# Patient Record
Sex: Female | Born: 1950 | Race: White | Hispanic: No | Smoking: Never smoker
Health system: Southern US, Community
[De-identification: ages and names within clinical notes are randomized; demographics above are authoritative.]

## PROBLEM LIST (undated history)

## (undated) DIAGNOSIS — M8580 Other specified disorders of bone density and structure, unspecified site: Secondary | ICD-10-CM

## (undated) DIAGNOSIS — T7840XA Allergy, unspecified, initial encounter: Secondary | ICD-10-CM

## (undated) DIAGNOSIS — D126 Benign neoplasm of colon, unspecified: Secondary | ICD-10-CM

## (undated) DIAGNOSIS — E041 Nontoxic single thyroid nodule: Secondary | ICD-10-CM

## (undated) DIAGNOSIS — I10 Essential (primary) hypertension: Secondary | ICD-10-CM

## (undated) DIAGNOSIS — K589 Irritable bowel syndrome without diarrhea: Secondary | ICD-10-CM

## (undated) DIAGNOSIS — E559 Vitamin D deficiency, unspecified: Secondary | ICD-10-CM

## (undated) DIAGNOSIS — N200 Calculus of kidney: Secondary | ICD-10-CM

## (undated) DIAGNOSIS — E785 Hyperlipidemia, unspecified: Secondary | ICD-10-CM

## (undated) DIAGNOSIS — K219 Gastro-esophageal reflux disease without esophagitis: Secondary | ICD-10-CM

## (undated) HISTORY — DX: Other specified disorders of bone density and structure, unspecified site: M85.80

## (undated) HISTORY — DX: Hyperlipidemia, unspecified: E78.5

## (undated) HISTORY — DX: Allergy, unspecified, initial encounter: T78.40XA

## (undated) HISTORY — PX: TONSILLECTOMY: SUR1361

## (undated) HISTORY — PX: BREAST ENHANCEMENT SURGERY: SHX7

## (undated) HISTORY — DX: Irritable bowel syndrome, unspecified: K58.9

## (undated) HISTORY — DX: Benign neoplasm of colon, unspecified: D12.6

## (undated) HISTORY — DX: Gastro-esophageal reflux disease without esophagitis: K21.9

## (undated) HISTORY — DX: Nontoxic single thyroid nodule: E04.1

## (undated) HISTORY — DX: Vitamin D deficiency, unspecified: E55.9

## (undated) HISTORY — DX: Calculus of kidney: N20.0

---

## 1998-02-18 ENCOUNTER — Other Ambulatory Visit: Admission: RE | Admit: 1998-02-18 | Discharge: 1998-02-18 | Payer: Self-pay | Admitting: Obstetrics and Gynecology

## 1999-08-26 ENCOUNTER — Other Ambulatory Visit: Admission: RE | Admit: 1999-08-26 | Discharge: 1999-08-26 | Payer: Self-pay | Admitting: Obstetrics and Gynecology

## 1999-12-09 ENCOUNTER — Ambulatory Visit (HOSPITAL_COMMUNITY): Admission: RE | Admit: 1999-12-09 | Discharge: 1999-12-09 | Payer: Self-pay | Admitting: Internal Medicine

## 1999-12-09 ENCOUNTER — Encounter: Payer: Self-pay | Admitting: Internal Medicine

## 2000-02-10 ENCOUNTER — Encounter: Payer: Self-pay | Admitting: Gastroenterology

## 2000-02-10 ENCOUNTER — Encounter (INDEPENDENT_AMBULATORY_CARE_PROVIDER_SITE_OTHER): Payer: Self-pay

## 2000-02-10 ENCOUNTER — Other Ambulatory Visit: Admission: RE | Admit: 2000-02-10 | Discharge: 2000-02-10 | Payer: Self-pay | Admitting: Gastroenterology

## 2000-08-12 ENCOUNTER — Other Ambulatory Visit: Admission: RE | Admit: 2000-08-12 | Discharge: 2000-08-12 | Payer: Self-pay | Admitting: Obstetrics and Gynecology

## 2001-08-18 ENCOUNTER — Other Ambulatory Visit: Admission: RE | Admit: 2001-08-18 | Discharge: 2001-08-18 | Payer: Self-pay | Admitting: Obstetrics and Gynecology

## 2002-12-28 ENCOUNTER — Other Ambulatory Visit: Admission: RE | Admit: 2002-12-28 | Discharge: 2002-12-28 | Payer: Self-pay | Admitting: Obstetrics and Gynecology

## 2004-01-06 ENCOUNTER — Other Ambulatory Visit: Admission: RE | Admit: 2004-01-06 | Discharge: 2004-01-06 | Payer: Self-pay | Admitting: Obstetrics and Gynecology

## 2005-01-14 ENCOUNTER — Other Ambulatory Visit: Admission: RE | Admit: 2005-01-14 | Discharge: 2005-01-14 | Payer: Self-pay | Admitting: *Deleted

## 2005-12-13 ENCOUNTER — Ambulatory Visit: Payer: Self-pay | Admitting: Internal Medicine

## 2006-01-20 ENCOUNTER — Other Ambulatory Visit: Admission: RE | Admit: 2006-01-20 | Discharge: 2006-01-20 | Payer: Self-pay | Admitting: Obstetrics & Gynecology

## 2006-02-22 ENCOUNTER — Ambulatory Visit: Payer: Self-pay | Admitting: Internal Medicine

## 2006-03-28 ENCOUNTER — Encounter: Admission: RE | Admit: 2006-03-28 | Discharge: 2006-03-28 | Payer: Self-pay | Admitting: Orthopaedic Surgery

## 2006-07-05 ENCOUNTER — Encounter: Admission: RE | Admit: 2006-07-05 | Discharge: 2006-07-05 | Payer: Self-pay | Admitting: Orthopaedic Surgery

## 2007-01-16 ENCOUNTER — Other Ambulatory Visit: Admission: RE | Admit: 2007-01-16 | Discharge: 2007-01-16 | Payer: Self-pay | Admitting: Obstetrics & Gynecology

## 2008-01-25 ENCOUNTER — Other Ambulatory Visit: Admission: RE | Admit: 2008-01-25 | Discharge: 2008-01-25 | Payer: Self-pay | Admitting: Obstetrics and Gynecology

## 2008-05-28 HISTORY — PX: TOTAL HIP ARTHROPLASTY: SHX124

## 2009-01-28 ENCOUNTER — Telehealth: Payer: Self-pay | Admitting: Gastroenterology

## 2009-01-31 ENCOUNTER — Ambulatory Visit: Payer: Self-pay | Admitting: Gastroenterology

## 2009-02-25 DIAGNOSIS — D126 Benign neoplasm of colon, unspecified: Secondary | ICD-10-CM

## 2009-02-25 HISTORY — DX: Benign neoplasm of colon, unspecified: D12.6

## 2009-03-04 ENCOUNTER — Ambulatory Visit: Payer: Self-pay | Admitting: Gastroenterology

## 2009-03-04 ENCOUNTER — Encounter: Payer: Self-pay | Admitting: Gastroenterology

## 2009-03-04 LAB — HM COLONOSCOPY

## 2009-03-05 ENCOUNTER — Encounter: Payer: Self-pay | Admitting: Gastroenterology

## 2009-05-20 ENCOUNTER — Ambulatory Visit (HOSPITAL_COMMUNITY): Admission: RE | Admit: 2009-05-20 | Discharge: 2009-05-20 | Payer: Self-pay | Admitting: Obstetrics & Gynecology

## 2010-09-16 ENCOUNTER — Telehealth: Payer: Self-pay | Admitting: Gastroenterology

## 2010-10-18 ENCOUNTER — Encounter: Payer: Self-pay | Admitting: Orthopaedic Surgery

## 2010-10-21 ENCOUNTER — Ambulatory Visit: Admit: 2010-10-21 | Payer: Self-pay | Admitting: Gastroenterology

## 2010-10-29 NOTE — Progress Notes (Signed)
Summary: Stomach problems  Phone Note Call from Patient Call back at [phone]   Call For: DR [facility] Reason for Call: Talk to Nurse Summary of Call: Having stomach problems and doesnt think she can wait until next avail appt on 10-21-09. Initial call taken by: Leanor Kail [facility],  September 16, 2010 12:14 PM  Follow-up for Phone Call        patient is advised that no earlier appointments at this time.  I have placed her in the schedule for 10-21-10 and have placed her on the cancelation list. Follow-up by: Darcey Nora RN, CGRN,  September 16, 2010 1:56 PM

## 2011-05-26 ENCOUNTER — Ambulatory Visit (INDEPENDENT_AMBULATORY_CARE_PROVIDER_SITE_OTHER): Payer: BC Managed Care – PPO | Admitting: Gastroenterology

## 2011-05-26 ENCOUNTER — Encounter: Payer: Self-pay | Admitting: Gastroenterology

## 2011-05-26 VITALS — BP 118/64 | HR 80 | Ht 64.0 in | Wt 141.0 lb

## 2011-05-26 DIAGNOSIS — K5900 Constipation, unspecified: Secondary | ICD-10-CM

## 2011-05-26 DIAGNOSIS — Z860100 Personal history of colon polyps, unspecified: Secondary | ICD-10-CM

## 2011-05-26 DIAGNOSIS — K219 Gastro-esophageal reflux disease without esophagitis: Secondary | ICD-10-CM

## 2011-05-26 NOTE — Progress Notes (Signed)
History of Present Illness: This is a 60 year old female who relates worsening problems with constipation bloating and reflux symptoms over the course of the summer. She was evaluated by her primary physician after not having a bowel movement for 7 days. She reports that abdominal films showed constipation. She reports a negative abdominal ultrasound and negative H. pylori antibody. She was started on omeprazole metoclopramide Restora and MiraLax and all her symptoms have completely abated. She underwent colonoscopy in June 2010 showing small adenomatous colon polyps. She currently denies weight loss, abdominal pain, constipation, diarrhea, change in stool caliber, melena, hematochezia, nausea, vomiting, dysphagia, reflux symptoms, chest pain.  Will request records from her primary physician.  Current Medications, Allergies, Past Medical History, Past Surgical History, Family History and Social History were reviewed in [employer] record.  Physical Exam: General: Well developed , well nourished, no acute distress Head: Normocephalic and atraumatic Eyes:  sclerae anicteric, EOMI Ears: Normal auditory acuity Mouth: No deformity or lesions Lungs: Clear throughout to auscultation Heart: Regular rate and rhythm; no murmurs, rubs or bruits Abdomen: Soft, non tender and non distended. No masses, hepatosplenomegaly or hernias noted. Normal Bowel sounds Musculoskeletal: Symmetrical with no gross deformities  Pulses:  Normal pulses noted Extremities: No clubbing, cyanosis, edema or deformities noted Neurological: Alert oriented x 4, grossly nonfocal Psychological:  Alert and cooperative. Normal mood and affect  Assessment and Recommendations:  1. Constipation. High-fiber diet with liberal water intake. Titrate MiraLax between every other day to twice daily for adequate bowel movements. Consider discontinuing probiotics.  2. GERD. Currently asymptomatic. She was not symptomatic  until her constipation flared. Discontinue metoclopramide. Begin all standard antireflux measures. If her reflux symptoms remain under control she can decrease omeprazole to daily and then use daily as needed. If her reflux symptoms remain active consider upper endoscopy for further evaluation.  3. Personal history of adenomatous colon polyps. Surveillance colonoscopy recommended June 2015.

## 2011-05-26 NOTE — Patient Instructions (Addendum)
Start Miralax 17 grams in 8 oz of water once daily to every other day titrating for constipation.  Constipation information given. Stop metoclopramide. Stop omeprazole in 2 weeks and then use as needed.  cc: Jeanmarie Plant, MD

## 2011-05-27 ENCOUNTER — Encounter: Payer: Self-pay | Admitting: Gastroenterology

## 2012-01-17 DIAGNOSIS — Z96649 Presence of unspecified artificial hip joint: Secondary | ICD-10-CM | POA: Insufficient documentation

## 2012-01-17 DIAGNOSIS — Z9889 Other specified postprocedural states: Secondary | ICD-10-CM | POA: Insufficient documentation

## 2012-07-12 ENCOUNTER — Ambulatory Visit: Payer: BC Managed Care – PPO | Admitting: Gastroenterology

## 2013-01-05 ENCOUNTER — Other Ambulatory Visit (INDEPENDENT_AMBULATORY_CARE_PROVIDER_SITE_OTHER): Payer: BC Managed Care – PPO

## 2013-01-05 ENCOUNTER — Encounter: Payer: Self-pay | Admitting: Internal Medicine

## 2013-01-05 ENCOUNTER — Ambulatory Visit (INDEPENDENT_AMBULATORY_CARE_PROVIDER_SITE_OTHER): Payer: BC Managed Care – PPO | Admitting: Internal Medicine

## 2013-01-05 VITALS — BP 130/88 | HR 65 | Temp 98.5°F | Ht 64.0 in | Wt 143.8 lb

## 2013-01-05 DIAGNOSIS — Z0000 Encounter for general adult medical examination without abnormal findings: Secondary | ICD-10-CM

## 2013-01-05 DIAGNOSIS — M899 Disorder of bone, unspecified: Secondary | ICD-10-CM

## 2013-01-05 DIAGNOSIS — Z130 Encounter for screening for diseases of the blood and blood-forming organs and certain disorders involving the immune mechanism: Secondary | ICD-10-CM

## 2013-01-05 DIAGNOSIS — M8580 Other specified disorders of bone density and structure, unspecified site: Secondary | ICD-10-CM

## 2013-01-05 DIAGNOSIS — Z13220 Encounter for screening for lipoid disorders: Secondary | ICD-10-CM

## 2013-01-05 DIAGNOSIS — Z1329 Encounter for screening for other suspected endocrine disorder: Secondary | ICD-10-CM

## 2013-01-05 DIAGNOSIS — Z131 Encounter for screening for diabetes mellitus: Secondary | ICD-10-CM

## 2013-01-05 DIAGNOSIS — M949 Disorder of cartilage, unspecified: Secondary | ICD-10-CM

## 2013-01-05 LAB — BASIC METABOLIC PANEL
BUN: 11 mg/dL (ref 6–23)
Chloride: 102 mEq/L (ref 96–112)
Glucose, Bld: 83 mg/dL (ref 70–99)
Potassium: 3.9 mEq/L (ref 3.5–5.1)
Sodium: 137 mEq/L (ref 135–145)

## 2013-01-05 LAB — LIPID PANEL
HDL: 78.3 mg/dL (ref >39.00)
Triglycerides: 121 mg/dL (ref 0.0–149.0)
VLDL: 24.2 mg/dL (ref 0.0–40.0)

## 2013-01-05 LAB — CBC
MCHC: 33.5 g/dL (ref 30.0–36.0)
Platelets: 274 10*3/uL (ref 150.0–400.0)
RBC: 4.87 Mil/uL (ref 3.87–5.11)

## 2013-01-05 LAB — TSH: TSH: 0.6 u[IU]/mL (ref 0.35–5.50)

## 2013-01-05 LAB — HEMOGLOBIN A1C: Hgb A1c MFr Bld: 5.4 % (ref 4.6–6.5)

## 2013-01-05 LAB — LDL CHOLESTEROL, DIRECT: Direct LDL: 179.1 mg/dL

## 2013-01-05 NOTE — Patient Instructions (Signed)

## 2013-01-05 NOTE — Progress Notes (Signed)
HPI  Pt presents to the clinic today to establish care. She is transferring care from her Doctor in [place] who is retiring. She would like blood work today. She has no concerns.  Flu: never Tetanus: 2009 Pap smear: 09/2012 LMP: post menopausal Colonoscopy: 2010 (5 years) dexa Scan: 2013 Eye doctor: yearly Dentist: Biannually  Past Medical History  Diagnosis Date  . Nephrolithiasis   . Osteopenia   . Hyperlipidemia   . Tubular adenoma of colon 02/2009  . GERD (gastroesophageal reflux disease)   . IBS (irritable bowel syndrome)   . Allergy     Current Outpatient Prescriptions  Medication Sig Dispense Refill  . Calcium Carbonate-Vitamin D (CALTRATE 600+D PO) Take 1 capsule by mouth daily.        Marland Kitchen loratadine (CLARITIN) 10 MG tablet Take 10 mg by mouth daily.       No current facility-administered medications for this visit.    No Known Allergies  Family History  Problem Relation Age of Onset  . Colon cancer Neg Hx   . Atrial fibrillation Father     History   Social History  . Marital Status: Widowed    Spouse Name: N/A    Number of Children: N/A  . Years of Education: 14   Occupational History  . Self-employed     Event and Meeting Planning   Social History Main Topics  . Smoking status: Never Smoker   . Smokeless tobacco: Never Used  . Alcohol Use: Yes     Comment: 1 daily  . Drug Use: No  . Sexually Active: Not Currently   Other Topics Concern  . Not on file   Social History Narrative   Regular exercise-yes   Caffeine Use-yes    ROS:  Constitutional: Denies fever, malaise, fatigue, headache or abrupt weight changes.  HEENT: Pt reports nasal congestion. Denies eye pain, eye redness, ear pain, ringing in the ears, wax buildup, runny nose, bloody nose, or sore throat. Respiratory: Denies difficulty breathing, shortness of breath, cough or sputum production.   Cardiovascular: Denies chest pain, chest tightness, palpitations or swelling in the  hands or feet.  Gastrointestinal: Denies abdominal pain, bloating, constipation, diarrhea or blood in the stool.  GU: Denies frequency, urgency, pain with urination, blood in urine, odor or discharge. Musculoskeletal: Pt reports pain in left wrist. Denies decrease in range of motion, difficulty with gait, muscle pain.  Skin: Denies redness, rashes, lesions or ulcercations.  Neurological: Denies dizziness, difficulty with memory, difficulty with speech or problems with balance and coordination.   No other specific complaints in a complete review of systems (except as listed in HPI above).  PE:  BP 130/88  Pulse 65  Temp(Src) 98.5 F (36.9 C) (Oral)  Ht 5\' 4"  (1.626 m)  Wt 143 lb 12 oz (65.205 kg)  BMI 24.66 kg/m2  SpO2 99% Wt Readings from Last 3 Encounters:  01/05/13 143 lb 12 oz (65.205 kg)  05/26/11 141 lb (63.957 kg)  01/31/09 141 lb (63.957 kg)    General: Appears her stated age, well developed, well nourished in NAD. HEENT: Head: normal shape and size; Eyes: sclera white, no icterus, conjunctiva pink, PERRLA and EOMs intact; Ears: Tm's gray and intact, normal light reflex; Nose: mucosa pink and moist, deviated septum; Throat/Mouth: Teeth present, mucosa pink and moist, no lesions or ulcerations noted.  Neck: Normal range of motion. Neck supple, trachea midline. No massses, lumps or thyromegaly present.  Cardiovascular: Normal rate and rhythm. S1,S2 noted.  No  murmur, rubs or gallops noted. No JVD or BLE edema. No carotid bruits noted. Pulmonary/Chest: Normal effort and positive vesicular breath sounds. No respiratory distress. No wheezes, rales or ronchi noted.  Abdomen: Soft and nontender. Normal bowel sounds, no bruits noted. No distention or masses noted. Liver, spleen and kidneys non palpable. Musculoskeletal: Normal range of motion. No signs of joint swelling. No difficulty with gait.  Neurological: Alert and oriented. Cranial nerves II-XII intact. Coordination normal. +DTRs  bilaterally. Psychiatric: Mood and affect normal. Behavior is normal. Judgment and thought content normal.      Assessment and Plan:  Preventative Health Maintenance:  All HM UTD Will obtain basic screening labs  RTC in 1 year or sooner if needed

## 2013-01-26 ENCOUNTER — Encounter: Payer: Self-pay | Admitting: Internal Medicine

## 2013-01-26 ENCOUNTER — Ambulatory Visit (INDEPENDENT_AMBULATORY_CARE_PROVIDER_SITE_OTHER): Payer: BC Managed Care – PPO | Admitting: Internal Medicine

## 2013-01-26 VITALS — BP 130/90 | HR 64 | Temp 98.0°F | Ht 64.0 in | Wt 142.8 lb

## 2013-01-26 DIAGNOSIS — E785 Hyperlipidemia, unspecified: Secondary | ICD-10-CM | POA: Insufficient documentation

## 2013-01-26 DIAGNOSIS — E559 Vitamin D deficiency, unspecified: Secondary | ICD-10-CM

## 2013-01-26 DIAGNOSIS — J309 Allergic rhinitis, unspecified: Secondary | ICD-10-CM

## 2013-01-26 MED ORDER — METHYLPREDNISOLONE ACETATE 80 MG/ML IJ SUSP
80.0000 mg | Freq: Once | INTRAMUSCULAR | Status: AC
  Administered 2013-01-26: 80 mg via INTRAMUSCULAR

## 2013-01-26 MED ORDER — ERGOCALCIFEROL 1.25 MG (50000 UT) PO CAPS: 50000.0000 [IU] | ORAL_CAPSULE | ORAL | Status: DC

## 2013-01-26 NOTE — Addendum Note (Signed)
Addended by: Carin Primrose on: 01/26/2013 10:39 AM   Modules accepted: Orders

## 2013-01-26 NOTE — Progress Notes (Signed)
HPI  Pt presents to the clinic today with c/o allergy symptoms x 2 weeks. The worst part is the sore throat and dry cough. She does not produce any sputum. She denies fevers. She has tried Careers adviser, nasocort and cough drops which does seems to help. She does have a history of allergies but no asthma.  She does not have sick contacts. Additionally today she would like to follow up on her low Vit D level and her hyperlipidemia. She is on a Vit d and calcium supplement. She does exercise and watch her diet for fat and cholesterol. She is wondering if there is anything else she can do to bring up her vit d and lower her cholesterol.  Review of Systems      Past Medical History  Diagnosis Date  . Nephrolithiasis   . Osteopenia   . Hyperlipidemia   . Tubular adenoma of colon 02/2009  . GERD (gastroesophageal reflux disease)   . IBS (irritable bowel syndrome)   . Allergy     Family History  Problem Relation Age of Onset  . Colon cancer Neg Hx   . Atrial fibrillation Father     History   Social History  . Marital Status: Widowed    Spouse Name: N/A    Number of Children: N/A  . Years of Education: 14   Occupational History  . Self-employed     Event and Meeting Planning   Social History Main Topics  . Smoking status: Never Smoker   . Smokeless tobacco: Never Used  . Alcohol Use: Yes     Comment: 1 daily  . Drug Use: No  . Sexually Active: Not Currently   Other Topics Concern  . Not on file   Social History Narrative   Regular exercise-yes   Caffeine Use-yes    No Known Allergies   Constitutional: Positive headache, fatigue. Denies fever or abrupt weight changes.  HEENT:  Positive sore throat. Denies eye redness, eye pain, pressure behind the eyes, facial pain, nasal congestion, ear pain, ringing in the ears, wax buildup, runny nose or bloody nose. Respiratory: Positive cough. Denies difficulty breathing or shortness of breath.  Cardiovascular: Denies chest pain,  chest tightness, palpitations or swelling in the hands or feet.   No other specific complaints in a complete review of systems (except as listed in HPI above).  Objective:   BP 130/90  Pulse 64  Temp(Src) 98 F (36.7 C) (Oral)  Ht 5\' 4"  (1.626 m)  Wt 142 lb 12 oz (64.751 kg)  BMI 24.49 kg/m2  SpO2 99% Wt Readings from Last 3 Encounters:  01/26/13 142 lb 12 oz (64.751 kg)  01/05/13 143 lb 12 oz (65.205 kg)  05/26/11 141 lb (63.957 kg)     General: Appears her stated age, well developed, well nourished in NAD. HEENT: Head: normal shape and size; Eyes: sclera white, no icterus, conjunctiva pink, PERRLA and EOMs intact; Ears: Tm's gray and intact, normal light reflex; Nose: mucosa boggy and moist, septum midline; Throat/Mouth: + PND. Teeth present, mucosa erythematous and moist, no exudate noted, no lesions or ulcerations noted.  Neck: Mild cervical lymphadenopathy. Neck supple, trachea midline. No massses, lumps or thyromegaly present.  Cardiovascular: Normal rate and rhythm. S1,S2 noted.  No murmur, rubs or gallops noted. No JVD or BLE edema. No carotid bruits noted. Pulmonary/Chest: Normal effort and positive vesicular breath sounds. No respiratory distress. No wheezes, rales or ronchi noted.      Assessment & Plan:   Allergic  Rhinitis, new onset:  Get some rest and drink plenty of water Do salt water gargles for the sore throat Stop Claritn and start Allegra Continue nasocort daily 80 mg Depo IM today  Vit d Deficiency:  Stop taking your current supplement Will call in ergocalciferol 50000 units weekly x 12 weeks Will recheck Vit D in 3 months  RTC as needed or if symptoms persist.

## 2013-01-26 NOTE — Patient Instructions (Signed)
Allergic Rhinitis  Allergic rhinitis is when the mucous membranes in the nose respond to allergens. Allergens are particles in the air that cause your body to have an allergic reaction. This causes you to release allergic antibodies. Through a chain of events, these eventually cause you to release histamine into the blood stream (hence the use of antihistamines). Although meant to be protective to the body, it is this release that causes your discomfort, such as frequent sneezing, congestion and an itchy runny nose.    CAUSES    The pollen allergens may come from grasses, trees, and weeds. This is seasonal allergic rhinitis, or "hay fever." Other allergens cause year-round allergic rhinitis (perennial allergic rhinitis) such as house dust mite allergen, pet dander and mold spores.    SYMPTOMS     Nasal stuffiness (congestion).   Runny, itchy nose with sneezing and tearing of the eyes.   There is often an itching of the mouth, eyes and ears.  It cannot be cured, but it can be controlled with medications.  DIAGNOSIS    If you are unable to determine the offending allergen, skin or blood testing may find it.  TREATMENT     Avoid the allergen.   Medications and allergy shots (immunotherapy) can help.   Hay fever may often be treated with antihistamines in pill or nasal spray forms. Antihistamines block the effects of histamine. There are over-the-counter medicines that may help with nasal congestion and swelling around the eyes. Check with your caregiver before taking or giving this medicine.  If the treatment above does not work, there are many new medications your caregiver can prescribe. Stronger medications may be used if initial measures are ineffective. Desensitizing injections can be used if medications and avoidance fails. Desensitization is when a patient is given ongoing shots until the body becomes less sensitive to the allergen. Make sure you follow up with your caregiver if problems continue.   SEEK MEDICAL CARE IF:     You develop fever (more than 100.5 F (38.1 C).   You develop a cough that does not stop easily (persistent).   You have shortness of breath.   You start wheezing.   Symptoms interfere with normal daily activities.  Document Released: 06/08/2001 Document Revised: 12/06/2011 Document Reviewed: 12/18/2008  ExitCare Patient Information 2013 [employer].

## 2013-01-26 NOTE — Assessment & Plan Note (Signed)
Continue diet and exercise Can take OTC fish oil supplement Will recheck lipids in 3 months

## 2013-04-10 ENCOUNTER — Ambulatory Visit: Payer: BC Managed Care – PPO | Admitting: Internal Medicine

## 2013-05-08 ENCOUNTER — Ambulatory Visit: Payer: BC Managed Care – PPO | Admitting: Internal Medicine

## 2013-07-24 ENCOUNTER — Ambulatory Visit: Payer: Self-pay | Admitting: Obstetrics & Gynecology

## 2013-08-15 ENCOUNTER — Encounter: Payer: Self-pay | Admitting: Obstetrics & Gynecology

## 2013-08-16 ENCOUNTER — Telehealth: Payer: Self-pay | Admitting: Obstetrics & Gynecology

## 2013-08-16 ENCOUNTER — Ambulatory Visit: Payer: Self-pay | Admitting: Obstetrics & Gynecology

## 2013-08-16 NOTE — Telephone Encounter (Signed)
Patient is sick with a fever of 106 and sore throat reschedule aex for 12/23 at 3:30pm

## 2013-08-17 NOTE — Telephone Encounter (Signed)
Do not charge [facility] fee.  Thank you.

## 2013-08-29 ENCOUNTER — Ambulatory Visit: Payer: BC Managed Care – PPO | Admitting: Gastroenterology

## 2013-09-12 ENCOUNTER — Ambulatory Visit: Payer: BC Managed Care – PPO | Admitting: Gastroenterology

## 2013-09-12 ENCOUNTER — Telehealth: Payer: Self-pay | Admitting: Gastroenterology

## 2013-09-12 NOTE — Telephone Encounter (Signed)
Charge per Dr. Russella Dar for same day cancellation.

## 2013-09-18 ENCOUNTER — Ambulatory Visit (INDEPENDENT_AMBULATORY_CARE_PROVIDER_SITE_OTHER): Payer: BC Managed Care – PPO | Admitting: Obstetrics & Gynecology

## 2013-09-18 ENCOUNTER — Encounter: Payer: Self-pay | Admitting: Obstetrics & Gynecology

## 2013-09-18 VITALS — BP 112/70 | HR 74 | Resp 16 | Ht 63.5 in | Wt 142.0 lb

## 2013-09-18 DIAGNOSIS — Z01419 Encounter for gynecological examination (general) (routine) without abnormal findings: Secondary | ICD-10-CM

## 2013-09-18 NOTE — Progress Notes (Signed)
62 y.o. Bonnie Moon WidowedCaucasianF here for annual exam.  No vaginal bleeding.  25 coming tonight for dinner.    No LMP recorded. Patient is not currently having periods (Reason: Perimenopausal).           Sexually active: no  The current method of family planning is post menopausal status.    Exercising: yes   Occ Walking Smoker:  no  Health Maintenance: Pap: 09/14/12 neg History of abnormal Pap:  no MMG:  2014 normal Colonoscopy:  2010 (2) polyps, repeat in 2015,  BMD:   01/17/12 Osteopenia TDaP:  2010 Screening Labs: pcp, Hb today: PCP, Urine today: PCP   reports that she has never smoked. She has never used smokeless tobacco. She reports that she drinks about 0.5 ounces of alcohol per week. She reports that she does not use illicit drugs.  Past Medical History  Diagnosis Date  . Nephrolithiasis   . Osteopenia   . Hyperlipidemia   . Tubular adenoma of colon 02/2009  . GERD (gastroesophageal reflux disease)   . IBS (irritable bowel syndrome)   . Allergy     Past Surgical History  Procedure Laterality Date  . Tonsillectomy    . 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 Calcium Carbonate-Vitamin D (CALTRATE 600+D PO) Take 1 capsule by mouth daily.        . ergocalciferol (VITAMIN D2) 50000 UNITS capsule Take 1 capsule (50,000 Units total) by mouth once a week.  12 capsule  0  . fexofenadine (ALLEGRA) 180 MG tablet Take 180 mg by mouth daily.       No current facility-administered medications for this visit.    Family History  Problem Relation Age of Onset  . Colon cancer Neg Hx   . Atrial fibrillation Father   . Osteopenia Mother     ROS:  Pertinent items are noted in HPI.  Otherwise, a comprehensive ROS was negative.  Exam:   BP 112/70  Pulse 74  Resp 16  Ht 5' 3.5" (1.613 m)  Wt 142 lb (64.411 kg)  BMI 24.76 kg/m2  Weight change: +6lbs  Height: 5' 3.5" (161.3 cm)  Ht Readings from Last 3 Encounters:   09/18/13 5' 3.5" (1.613 m)  01/26/13 5\' 4"  (1.626 m)  01/05/13 5\' 4"  (1.626 m)    General appearance: alert, cooperative and appears stated age Head: Normocephalic, without obvious abnormality, atraumatic Neck: no adenopathy, supple, symmetrical, trachea midline and thyroid normal to inspection and palpation Lungs: clear to auscultation bilaterally Breasts: normal appearance, no masses or tenderness Heart: regular rate and rhythm Abdomen: soft, non-tender; bowel sounds normal; no masses,  no organomegaly Extremities: extremities normal, atraumatic, no cyanosis or edema Skin: Skin color, texture, turgor normal. No rashes or lesions Lymph nodes: Cervical, supraclavicular, and axillary nodes normal. No abnormal inguinal nodes palpated Neurologic: Grossly normal   Pelvic: External genitalia:  no lesions              Urethra:  normal appearing urethra with no masses, tenderness or lesions              Bartholins and Skenes: normal                 Vagina: normal appearing vagina with normal color and discharge, no lesions              Cervix: no lesions  Pap taken: no Bimanual Exam:  Uterus:  normal size, contour, position, consistency, mobility, non-tender              Adnexa: normal adnexa and no mass, fullness, tenderness               Rectovaginal: Confirms               Anus:  normal sphincter tone, no lesions  A:  Well Woman with normal exam  PMP, no HRT Osteopenia  P:   Mammogram yearly pap smear with  Neg HR HPV 10/13 Labs with PCP.  Follow-up early 2015 for repeat lipids. Pt will have colonoscopy this year with Dr. Russella Dar. Zostavax rx given. return annually or prn  An After Visit Summary was printed and given to the patient.

## 2013-09-18 NOTE — Patient Instructions (Signed)

## 2014-01-03 ENCOUNTER — Encounter: Payer: Self-pay | Admitting: Gastroenterology

## 2014-02-04 ENCOUNTER — Telehealth: Payer: Self-pay

## 2014-02-04 NOTE — Telephone Encounter (Signed)
Pharmacist notified.

## 2014-02-04 NOTE — Telephone Encounter (Signed)
Pharmacy called [facility] stating that pt is going out of town today at 2pm and request rx for prednisone. Per pharmacy, pt has used this in the past to help with eustachian tube dyfunction.

## 2014-02-04 NOTE — Telephone Encounter (Signed)
I have not seen her in a year and I dont use prednisone for ETD. She can try flonase OTC

## 2014-02-19 ENCOUNTER — Encounter: Payer: Self-pay | Admitting: Gastroenterology

## 2014-03-25 ENCOUNTER — Ambulatory Visit (AMBULATORY_SURGERY_CENTER): Payer: Self-pay

## 2014-03-25 VITALS — Ht 64.0 in | Wt 139.0 lb

## 2014-03-25 DIAGNOSIS — Z860100 Personal history of colon polyps, unspecified: Secondary | ICD-10-CM

## 2014-03-25 MED ORDER — MOVIPREP 100 G PO SOLR: 1.0000 | Freq: Once | ORAL | Status: DC

## 2014-03-25 NOTE — Progress Notes (Signed)
No allergies to eggs or soy No home oxygen No diet/weight loss meds No past problems with anesthesia  Has email  Emmi instructions given for colonoscopy 

## 2014-03-26 ENCOUNTER — Encounter: Payer: Self-pay | Admitting: Gastroenterology

## 2014-04-11 ENCOUNTER — Encounter: Payer: Self-pay | Admitting: Gastroenterology

## 2014-04-11 ENCOUNTER — Ambulatory Visit (AMBULATORY_SURGERY_CENTER): Payer: BC Managed Care – PPO | Admitting: Gastroenterology

## 2014-04-11 VITALS — BP 185/90 | HR 56 | Temp 97.5°F | Resp 10 | Ht 64.0 in | Wt 139.0 lb

## 2014-04-11 DIAGNOSIS — Z860100 Personal history of colon polyps, unspecified: Secondary | ICD-10-CM

## 2014-04-11 HISTORY — PX: COLONOSCOPY: SHX174

## 2014-04-11 MED ORDER — SODIUM CHLORIDE 0.9 % IV SOLN: 500.0000 mL | INTRAVENOUS | Status: DC

## 2014-04-11 NOTE — Progress Notes (Signed)
Patient's IV was working well with good blood flow before she got up to go to the bathroom.  After that, it was slightly swollen so it was discontinued.   A pressure dressing was applied.  A warm pack was applied.   Edmon Crape was called to re-start the IV.     After about 20 minutes, the patient called me over to look at her arm.  It was the same as before, but she stated that it was "bothering her."  I put some ice on it at that time.   The tech came to get her and she told the tech that it felt "much better."  She was taken to the procedure room at that time.   The area was a little red due to the ice.  No open areas noted; again, the patient stated that it "felt much better."

## 2014-04-11 NOTE — Op Note (Signed)
[place]  [address] [place], [id number]   COLONOSCOPY PROCEDURE REPORT  PATIENT: Bonnie, Moon  MR#: [id number] BIRTHDATE: 05-16-1951 , 63  yrs. old GENDER: Female ENDOSCOPIST: Ladene Artist, MD, [facility] PROCEDURE DATE:  04/11/2014 PROCEDURE:   Colonoscopy, surveillance First Screening Colonoscopy - Avg.  risk and is 50 yrs.  old or older - No.  Prior Negative Screening - Now for repeat screening. N/A  History of Adenoma - Now for follow-up colonoscopy & has been > or = to 3 yrs.  Yes hx of adenoma.  Has been 3 or more years since last colonoscopy.  Polyps Removed Today? No.  Recommend repeat exam, <10 yrs? Yes.  High risk (family or personal hx). ASA CLASS:   Class II INDICATIONS:Patient's personal history of adenomatous colon polyps.  MEDICATIONS: MAC sedation, administered by CRNA and propofol (Diprivan) 200mg  IV DESCRIPTION OF PROCEDURE:   After the risks benefits and alternatives of the procedure were thoroughly explained, informed consent was obtained.  A digital rectal exam revealed no abnormalities of the rectum.   The [id number]  endoscope was introduced through the anus and advanced to the cecum, which was identified by both the appendix and ileocecal valve. No adverse events experienced.   The quality of the prep was excellent, using MoviPrep  The instrument was then slowly withdrawn as the colon was fully examined.  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  No bleeding was noted.   The colon was otherwise normal.  There was no diverticulosis, inflammation, polyps or cancers unless previously stated.  Retroflexed views revealed no abnormalities. The time to cecum=4 minutes 13 seconds.  Withdrawal time=9 minutes 23 seconds. The scope was withdrawn and the procedure completed.  COMPLICATIONS: There were no complications.  ENDOSCOPIC IMPRESSION: 1.   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   The colon was otherwise  normal  RECOMMENDATIONS: 1.  Repeat Colonoscopy in 5 years.  eSigned:  Ladene Artist, MD, [facility] 04/11/2014 8:58 AM

## 2014-04-11 NOTE — Progress Notes (Signed)
A/ox3 pleased with MAC, report to Penny RN 

## 2014-04-11 NOTE — Patient Instructions (Signed)
YOU HAD AN ENDOSCOPIC PROCEDURE TODAY AT THE [facility]: Refer to the procedure report that was given to you for any specific questions about what was found during the examination.  If the procedure report does not answer your questions, please call your gastroenterologist to clarify.  If you requested that your care partner not be given the details of your procedure findings, then the procedure report has been included in a sealed envelope for you to review at your convenience later.  YOU SHOULD EXPECT: Some feelings of bloating in the abdomen. Passage of more gas than usual.  Walking can help get rid of the air that was put into your GI tract during the procedure and reduce the bloating. If you had a lower endoscopy (such as a colonoscopy or flexible sigmoidoscopy) you may notice spotting of blood in your stool or on the toilet paper. If you underwent a bowel prep for your procedure, then you may not have a normal bowel movement for a few days.  DIET: Your first meal following the procedure should be a light meal and then it is ok to progress to your normal diet.  A half-sandwich or bowl of soup is an example of a good first meal.  Heavy or fried foods are harder to digest and may make you feel nauseous or bloated.  Likewise meals heavy in dairy and vegetables can cause extra gas to form and this can also increase the bloating.  Drink plenty of fluids but you should avoid alcoholic beverages for 24 hours.  ACTIVITY: Your care partner should take you home directly after the procedure.  You should plan to take it easy, moving slowly for the rest of the day.  You can resume normal activity the day after the procedure however you should NOT DRIVE or use heavy machinery for 24 hours (because of the sedation medicines used during the test).    SYMPTOMS TO REPORT IMMEDIATELY: A gastroenterologist can be reached at any hour.  During normal business hours, 8:30 AM to 5:00 PM Monday through Friday,  call [phone].  After hours and on weekends, please call the GI answering service at [phone] who will take a message and have the physician on call contact you.   Following lower endoscopy (colonoscopy or flexible sigmoidoscopy):  Excessive amounts of blood in the stool  Significant tenderness or worsening of abdominal pains  Swelling of the abdomen that is new, acute  Fever of 100F or higher    FOLLOW UP: If any biopsies were taken you will be contacted by phone or by letter within the next 1-3 weeks.  Call your gastroenterologist if you have not heard about the biopsies in 3 weeks.  Our staff will call the home number listed on your records the next business day following your procedure to check on you and address any questions or concerns that you may have at that time regarding the information given to you following your procedure. This is a courtesy call and so if there is no answer at the home number and we have not heard from you through the emergency physician on call, we will assume that you have returned to your regular daily activities without incident.  SIGNATURES/CONFIDENTIALITY: You and/or your care partner have signed paperwork which will be entered into your electronic medical record.  These signatures attest to the fact that that the information above on your After Visit Summary has been reviewed and is understood.  Full responsibility of the confidentiality   of this discharge information lies with you and/or your care-partner.     

## 2014-04-12 ENCOUNTER — Telehealth: Payer: Self-pay | Admitting: *Deleted

## 2014-04-12 NOTE — Telephone Encounter (Signed)
  Follow up Call-  Call back number 04/11/2014  Post procedure Call Back phone  # [phone]  Permission to leave phone message Yes     Patient questions:  Do you have a fever, pain , or abdominal swelling? No.   Stated that IV site was fine. Pain Score  0 *  Have you tolerated food without any problems? Yes.    Have you been able to return to your normal activities? Yes.    Do you have any questions about your discharge instructions: Diet   No. Medications  No. Follow up visit  No.  Do you have questions or concerns about your Care? No  Actions: * If pain score is 4 or above: No action needed, pain <4.

## 2014-07-29 ENCOUNTER — Encounter: Payer: Self-pay | Admitting: Gastroenterology

## 2014-09-24 ENCOUNTER — Telehealth: Payer: Self-pay | Admitting: Obstetrics & Gynecology

## 2014-09-24 DIAGNOSIS — Z0000 Encounter for general adult medical examination without abnormal findings: Secondary | ICD-10-CM

## 2014-09-24 NOTE — Telephone Encounter (Signed)
Spoke with patient. Patient states that her PCP retired a year ago and she has not been able to have blood work checked this year. Has lab appointment scheduled at our office before annual on 10/08/14. "I just want to make sure that everything I need for a woman my age is checked. I know I need my cholesterol, vitamin D, thyroid, and iron checked. I also want my kidney function and WBC checked. I have had bronchitis for a couple of weeks. I just want to make sure everything is covered."  Advised patient will send a message over to [facility] and all labs recommended will be order for lab appointment. Patient is agreeable.

## 2014-09-24 NOTE — Telephone Encounter (Signed)
Orders placed for labs for pt.  Encounter closed.

## 2014-09-24 NOTE — Telephone Encounter (Signed)
Patient called and scheduled fasting labs for 10/08/17. AEX 10/14/14. She will need orders for the testing but also has some questions for the nurse about what needs tested. Please advise patient.

## 2014-10-08 ENCOUNTER — Other Ambulatory Visit (INDEPENDENT_AMBULATORY_CARE_PROVIDER_SITE_OTHER): Payer: BLUE CROSS/BLUE SHIELD

## 2014-10-08 DIAGNOSIS — Z0000 Encounter for general adult medical examination without abnormal findings: Secondary | ICD-10-CM

## 2014-10-08 LAB — COMPREHENSIVE METABOLIC PANEL WITH GFR
ALT: 14 U/L (ref 0–35)
AST: 20 U/L (ref 0–37)
Albumin: 4 g/dL (ref 3.5–5.2)
Alkaline Phosphatase: 121 U/L — ABNORMAL HIGH (ref 39–117)
BUN: 14 mg/dL (ref 6–23)
CO2: 28 meq/L (ref 19–32)
Calcium: 10.2 mg/dL (ref 8.4–10.5)
Chloride: 104 meq/L (ref 96–112)
Creat: 0.7 mg/dL (ref 0.50–1.10)
Glucose, Bld: 69 mg/dL — ABNORMAL LOW (ref 70–99)
Potassium: 4.1 meq/L (ref 3.5–5.3)
Sodium: 140 meq/L (ref 135–145)
Total Bilirubin: 0.5 mg/dL (ref 0.2–1.2)
Total Protein: 6.8 g/dL (ref 6.0–8.3)

## 2014-10-08 LAB — LIPID PANEL
Cholesterol: 259 mg/dL — ABNORMAL HIGH (ref 0–200)
HDL: 79 mg/dL
LDL Cholesterol: 161 mg/dL — ABNORMAL HIGH (ref 0–99)
Total CHOL/HDL Ratio: 3.3 ratio
Triglycerides: 96 mg/dL
VLDL: 19 mg/dL (ref 0–40)

## 2014-10-08 LAB — CBC
HCT: 41.5 % (ref 36.0–46.0)
Hemoglobin: 13.7 g/dL (ref 12.0–15.0)
MCH: 29.9 pg (ref 26.0–34.0)
MCHC: 33 g/dL (ref 30.0–36.0)
MCV: 90.6 fL (ref 78.0–100.0)
MPV: 9.2 fL (ref 8.6–12.4)
PLATELETS: 255 10*3/uL (ref 150–400)
RBC: 4.58 MIL/uL (ref 3.87–5.11)
RDW: 14 % (ref 11.5–15.5)
WBC: 6.1 10*3/uL (ref 4.0–10.5)

## 2014-10-08 LAB — TSH: TSH: 0.577 u[IU]/mL (ref 0.350–4.500)

## 2014-10-09 LAB — VITAMIN D 25 HYDROXY (VIT D DEFICIENCY, FRACTURES): Vit D, 25-Hydroxy: 30 ng/mL (ref 30–100)

## 2014-10-14 ENCOUNTER — Ambulatory Visit (INDEPENDENT_AMBULATORY_CARE_PROVIDER_SITE_OTHER): Payer: BLUE CROSS/BLUE SHIELD | Admitting: Obstetrics & Gynecology

## 2014-10-14 ENCOUNTER — Encounter: Payer: Self-pay | Admitting: Obstetrics & Gynecology

## 2014-10-14 VITALS — BP 124/82 | HR 56 | Resp 16 | Ht 63.75 in | Wt 139.6 lb

## 2014-10-14 DIAGNOSIS — Z01419 Encounter for gynecological examination (general) (routine) without abnormal findings: Secondary | ICD-10-CM

## 2014-10-14 DIAGNOSIS — Z124 Encounter for screening for malignant neoplasm of cervix: Secondary | ICD-10-CM

## 2014-10-14 DIAGNOSIS — E785 Hyperlipidemia, unspecified: Secondary | ICD-10-CM

## 2014-10-14 DIAGNOSIS — Z0000 Encounter for general adult medical examination without abnormal findings: Secondary | ICD-10-CM

## 2014-10-14 LAB — POCT URINALYSIS DIPSTICK
Bilirubin, UA: NEGATIVE
Blood, UA: NEGATIVE
Glucose, UA: NEGATIVE
Ketones, UA: NEGATIVE
Leukocytes, UA: NEGATIVE
NITRITE UA: NEGATIVE
Protein, UA: NEGATIVE
Urobilinogen, UA: NEGATIVE
pH, UA: 7

## 2014-10-14 NOTE — Progress Notes (Signed)
64 y.o. G4P3 WidowedCaucasianF here for annual exam.  Doing well.  No vaginal bleeding.  Needs new PCP.  Labs done last week.  Cholesterol elevated.  Would prefer to see a woman.  Reports she had a cough and URI for about two weeks over the holidays.  Pt still has a cough.    Patient's last menstrual period was 07/28/2002.          Sexually active: No.  The current method of family planning is post menopausal status.    Exercising: Yes.    walking Smoker:  no  Health Maintenance: Pap:  09/14/12 WNL/negative HR HPV History of abnormal Pap:  no MMG:  06/10/14 3D-normal Colonoscopy:  2015-repeat in 5 years, negative, h/o adenomatous polyps, Dr. Fuller Plan.  BMD:   01/17/12-osteoporosis, -2.2/-2.3 (stable from 2011) TDaP:  2010 Screening Labs: here, Hb today: not needed, Urine today: PH-7.0   reports that she has never smoked. She has never used smokeless tobacco. She reports that she drinks about 1.2 oz of alcohol per week. She reports that she does not use illicit drugs.  Past Medical History  Diagnosis Date  . Nephrolithiasis   . Osteopenia   . Hyperlipidemia   . Tubular adenoma of colon 02/2009  . GERD (gastroesophageal reflux disease)   . IBS (irritable bowel syndrome)   . Allergy     Past Surgical History  Procedure Laterality Date  . Tonsillectomy    . 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 calcium carbonate (OS-CAL) 600 MG TABS tablet Take 600 mg by mouth 2 (two) times daily with a meal.    . Cholecalciferol (VITAMIN D-3) 1000 UNITS CAPS Take by mouth.    . Multiple Vitamins-Minerals (MULTIVITAMIN PO) Take by mouth. gummies 2 daily     No current facility-administered medications for this visit.    Family History  Problem Relation Age of Onset  . Colon cancer Neg Hx   . Pancreatic cancer Neg Hx   . Rectal cancer Neg Hx   . Stomach cancer Neg Hx   . Atrial fibrillation Father   . Osteopenia Mother   . Heart  disease Mother   . Thyroid disease Daughter     thyroidectomy-nodules/cancer  . High Cholesterol Daughter   . High Cholesterol Mother     ROS:  Pertinent items are noted in HPI.  Otherwise, a comprehensive ROS was negative.  Exam:   BP 124/82 mmHg  Pulse 56  Resp 16  Ht 5' 3.75" (1.619 m)  Wt 139 lb 9.6 oz (63.322 kg)  BMI 24.16 kg/m2  LMP 07/28/2002    Height: 5' 3.75" (161.9 cm)  Ht Readings from Last 3 Encounters:  10/14/14 5' 3.75" (1.619 m)  04/11/14 5\' 4"  (1.626 m)  03/25/14 5\' 4"  (1.626 m)    General appearance: alert, cooperative and appears stated age Head: Normocephalic, without obvious abnormality, atraumatic Neck: no adenopathy, supple, symmetrical, trachea midline and thyroid normal to inspection and palpation Lungs: clear to auscultation bilaterally Breasts: normal appearance, no masses or tenderness , bilateral implants Heart: regular rate and rhythm Abdomen: soft, non-tender; bowel sounds normal; no masses,  no organomegaly Extremities: extremities normal, atraumatic, no cyanosis or edema Skin: Skin color, texture, turgor normal. No rashes or lesions Lymph nodes: Cervical, supraclavicular, and axillary nodes normal. No abnormal inguinal nodes palpated Neurologic: Grossly normal   Pelvic: External genitalia:  no lesions  Urethra:  normal appearing urethra with no masses, tenderness or lesions              Bartholins and Skenes: normal                 Vagina: normal appearing vagina with normal color and discharge, no lesions              Cervix: no lesions              Pap taken: Yes.   Bimanual Exam:  Uterus:  normal size, contour, position, consistency, mobility, non-tender              Adnexa: normal adnexa and no mass, fullness, tenderness               Rectovaginal: Confirms               Anus:  normal sphincter tone, no lesions  Chaperone was present for exam.  A:  Well Woman with normal exam  PMP, no HRT Osteopenia Recent URI,  now with cough Several family members with thyroid disease  P: Mammogram yearly.  Will do BMD with MMG in the fall. pap smear with Neg HR HPV 10/13.  Pap today. Labs last week.  Elevated cholesterol.  Needs new PCP.  Will refer to Dr. Colin Benton Will plan to do shingles vaccine with Dr. Maudie Mercury return annually or prn

## 2014-10-17 LAB — IPS PAP TEST WITH REFLEX TO HPV

## 2014-10-29 ENCOUNTER — Telehealth: Payer: Self-pay | Admitting: Internal Medicine

## 2014-10-29 NOTE — Telephone Encounter (Signed)
Patient is being referred to Primary Care from [facility] for evaluation of hyperlipidemia.  Pt was previous pt of Webb Silversmith when she was at the [place] office.  However, now that Rollene Fare is at [facility] pt would like to see provider in [place].  Requesting to see Dr. Maudie Mercury.  Please advise if ok to transfer care to Dr. Maudie Mercury and schedule appt.  Insurance is [employer] [place].

## 2014-10-29 NOTE — Telephone Encounter (Signed)
Ok in new patient slot. Thank you.

## 2014-10-29 NOTE — Telephone Encounter (Signed)
Ok with me 

## 2015-01-13 ENCOUNTER — Ambulatory Visit: Payer: Self-pay | Admitting: Family Medicine

## 2015-01-23 ENCOUNTER — Ambulatory Visit: Payer: Self-pay | Admitting: Family Medicine

## 2015-05-07 ENCOUNTER — Ambulatory Visit: Payer: Self-pay | Admitting: Family Medicine

## 2015-07-10 ENCOUNTER — Ambulatory Visit (INDEPENDENT_AMBULATORY_CARE_PROVIDER_SITE_OTHER): Payer: BLUE CROSS/BLUE SHIELD | Admitting: Family Medicine

## 2015-07-10 ENCOUNTER — Encounter: Payer: Self-pay | Admitting: Family Medicine

## 2015-07-10 VITALS — BP 100/76 | HR 71 | Temp 98.0°F | Ht 63.0 in | Wt 141.1 lb

## 2015-07-10 DIAGNOSIS — E785 Hyperlipidemia, unspecified: Secondary | ICD-10-CM

## 2015-07-10 DIAGNOSIS — J309 Allergic rhinitis, unspecified: Secondary | ICD-10-CM | POA: Diagnosis not present

## 2015-07-10 DIAGNOSIS — R748 Abnormal levels of other serum enzymes: Secondary | ICD-10-CM

## 2015-07-10 DIAGNOSIS — Z7189 Other specified counseling: Secondary | ICD-10-CM | POA: Diagnosis not present

## 2015-07-10 DIAGNOSIS — Z1159 Encounter for screening for other viral diseases: Secondary | ICD-10-CM

## 2015-07-10 DIAGNOSIS — Z7689 Persons encountering health services in other specified circumstances: Secondary | ICD-10-CM

## 2015-07-10 NOTE — Progress Notes (Signed)
HPI:  Bonnie Moon is here to establish care. She used to see [employer].  Sees Dr. Sabra Heck for annual women's exams and sees Dr. Fuller Plan and a dermatologist. She reports she no longer has issues with GERD or IBS. 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 She has "terrible" allergic rhinitis and sees Dr. Tami Ribas in ENT for this. She takes claritin intermittently and nasal sprays. She does bone density exams with her gynecologist and has mild osteopenia per her report. History of elevated cholesterol in the past and she wants to check this. On review chart she also had a very mildly elevated alk phos in the past. She does have vit d. She is taking vit D.  Has the following chronic problems that require follow up and concerns today:   ROS negative for unless reported above: fevers, unintentional weight loss, hearing or vision loss, chest pain, palpitations, struggling to breath, hemoptysis, melena, hematochezia, hematuria, falls, loc, si, thoughts of self harm  Past Medical History  Diagnosis Date  . Nephrolithiasis   . Osteopenia   . Hyperlipidemia   . Tubular adenoma of colon 02/2009  . GERD (gastroesophageal reflux disease)   . IBS (irritable bowel syndrome)   . Allergy   . Vitamin D insufficiency     Past Surgical History  Procedure Laterality Date  . Tonsillectomy    . 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 Colon cancer Neg Hx   . Pancreatic cancer Neg Hx   . Rectal cancer Neg Hx   . Stomach cancer Neg Hx   . Atrial fibrillation Father   . Osteopenia Mother   . Heart disease Mother     a. fib  . High Cholesterol Mother   . Thyroid disease Daughter     thyroidectomy-nodules/cancer  . High Cholesterol Daughter     Social History   Social History  . Marital Status: Widowed    Spouse Name: N/A  . Number of Children: N/A  . Years of Education: 14    Occupational History  . Self-employed     Event and Meeting Planning   Social History Main Topics  . Smoking status: Never Smoker   . Smokeless tobacco: Never Used  . Alcohol Use: 1.2 oz/week    2 Standard drinks or equivalent per week     Comment: 1-2 drinks occ  . Drug Use: No  . Sexual Activity: No   Other Topics Concern  . None   Social History Narrative   Regular exercise-yes   Caffeine Use-yes   Lives alone   [place] [employer]   Regular exercise   Diet is good     Current outpatient prescriptions:  .  calcium carbonate (OS-CAL) 600 MG TABS tablet, Take 600 mg by mouth 2 (two) times daily with a meal., Disp: , Rfl:  .  Cholecalciferol (VITAMIN D-3) 1000 UNITS CAPS, Take by mouth., Disp: , Rfl:  .  Multiple Vitamins-Minerals (MULTIVITAMIN PO), Take by mouth. gummies 2 daily, Disp: , Rfl:   EXAM:  Filed Vitals:   07/10/15 1133  BP: 100/76  Pulse: 71  Temp: 98 F (36.7 C)    Body mass index is 25 kg/(m^2).  GENERAL: vitals reviewed and listed above, alert, oriented, appears well hydrated and in no acute distress  HEENT: atraumatic, conjunttiva clear, no obvious abnormalities on inspection of external  nose and ears  NECK: no obvious masses on inspection  LUNGS: clear to auscultation bilaterally, no wheezes, rales or rhonchi, good air movement  CV: HRRR, no peripheral edema  MS: moves all extremities without noticeable abnormality  PSYCH: pleasant and cooperative, no obvious depression or anxiety  ASSESSMENT AND PLAN:  Discussed the following assessment and plan:  Hyperlipemia  Elevated alkaline phosphatase level  Allergic rhinitis, unspecified allergic rhinitis type  Need for hepatitis C screening test  Encounter to establish care -We reviewed the PMH, PSH, FH, SH, Meds and Allergies. -We provided refills for any medications we will prescribe as needed. -We addressed current concerns per orders and patient instructions. -We have  asked for records for pertinent exams, studies, vaccines and notes from previous providers. -We have advised patient to follow up per instructions below.   -Patient advised to return or notify a doctor immediately if symptoms worsen or persist or new concerns arise.  Patient Instructions  It was nice to meet you today.  Please schedule a fasting lab appointment and consider getting the flu shot. Drink plenty of water prior to your lab visit but do not eat for 8 hours prior to labs  We recommend the following healthy lifestyle measures: - eat a healthy whole foods diet consisting of regular small meals composed of vegetables, fruits, beans, nuts, seeds, healthy meats such as white chicken and fish and whole grains.  - avoid sweets, white starchy foods, fried foods, fast food, processed foods, sodas, red meet and other fattening foods.  - get a least 150-300 minutes of aerobic exercise per week.   Follow up yearly and as needed     Crystel Demarco, Jarrett Soho R.

## 2015-07-10 NOTE — Patient Instructions (Signed)
It was nice to meet you today.  Please schedule a fasting lab appointment and consider getting the flu shot. Drink plenty of water prior to your lab visit but do not eat for 8 hours prior to labs  We recommend the following healthy lifestyle measures: - eat a healthy whole foods diet consisting of regular small meals composed of vegetables, fruits, beans, nuts, seeds, healthy meats such as white chicken and fish and whole grains.  - avoid sweets, white starchy foods, fried foods, fast food, processed foods, sodas, red meet and other fattening foods.  - get a least 150-300 minutes of aerobic exercise per week.   Follow up yearly and as needed

## 2015-07-10 NOTE — Progress Notes (Signed)
Pre visit review using our clinic review tool, if applicable. No additional management support is needed unless otherwise documented below in the visit note. 

## 2015-09-15 ENCOUNTER — Telehealth: Payer: Self-pay | Admitting: *Deleted

## 2015-09-15 NOTE — Telephone Encounter (Signed)
-----   Message from [name], DO sent at 09/15/2015  6:45 AM EST ----- Can you check why overdue? ----- Message -----    From: SYSTEM    Sent: 09/14/2015  12:04 AM      To: [name], DO

## 2015-09-15 NOTE — Telephone Encounter (Signed)
I called the pt and she stated she will call back to schedule a fasting lab appt as she has been busy but will schedule for next year.

## 2015-11-13 ENCOUNTER — Telehealth: Payer: Self-pay | Admitting: Obstetrics & Gynecology

## 2015-11-13 DIAGNOSIS — Z0000 Encounter for general adult medical examination without abnormal findings: Secondary | ICD-10-CM

## 2015-11-13 NOTE — Telephone Encounter (Signed)
Patient is scheduled for AEX 12/12/15 with Dr. Sabra Heck, patient would like to come in sooner so she can have bloodwork done and Dr. Sabra Heck can review it with her at her AEX. Scheduled patient for lab appt 12/08/15 okay to put in orders?

## 2015-11-13 NOTE — Telephone Encounter (Signed)
Orders placed for lab work patient had in 2016 so that she may have these rechecked this year. Future orders placed for lipid panel, TSH, CBC, CMP, and vitamin D. She is scheduled for lab appointment on 12/08/2015. Aex is scheduled for 12/12/2015 with Dr.Miller.  Routing to provider for final review. Patient agreeable to disposition. Will close encounter.

## 2015-12-08 ENCOUNTER — Other Ambulatory Visit: Payer: BLUE CROSS/BLUE SHIELD

## 2015-12-08 DIAGNOSIS — Z0000 Encounter for general adult medical examination without abnormal findings: Secondary | ICD-10-CM

## 2015-12-08 LAB — LIPID PANEL
CHOL/HDL RATIO: 3.6 ratio (ref <=5.0)
Cholesterol: 255 mg/dL — ABNORMAL HIGH (ref 125–200)
HDL: 70 mg/dL (ref >=46)
LDL CALC: 157 mg/dL — AB (ref <130)
Triglycerides: 141 mg/dL (ref <150)
VLDL: 28 mg/dL (ref <30)

## 2015-12-08 LAB — CBC
HEMATOCRIT: 41.4 % (ref 36.0–46.0)
Hemoglobin: 13.8 g/dL (ref 12.0–15.0)
MCH: 30.5 pg (ref 26.0–34.0)
MCHC: 33.3 g/dL (ref 30.0–36.0)
MCV: 91.4 fL (ref 78.0–100.0)
MPV: 9.2 fL (ref 8.6–12.4)
PLATELETS: 267 10*3/uL (ref 150–400)
RBC: 4.53 MIL/uL (ref 3.87–5.11)
RDW: 13.7 % (ref 11.5–15.5)
WBC: 5.6 10*3/uL (ref 4.0–10.5)

## 2015-12-08 LAB — COMPREHENSIVE METABOLIC PANEL
ALBUMIN: 4.2 g/dL (ref 3.6–5.1)
ALT: 12 U/L (ref 6–29)
AST: 19 U/L (ref 10–35)
Alkaline Phosphatase: 129 U/L (ref 33–130)
BUN: 12 mg/dL (ref 7–25)
CALCIUM: 10.3 mg/dL (ref 8.6–10.4)
CHLORIDE: 104 mmol/L (ref 98–110)
CO2: 29 mmol/L (ref 20–31)
Creat: 0.62 mg/dL (ref 0.50–0.99)
GLUCOSE: 81 mg/dL (ref 65–99)
POTASSIUM: 4.2 mmol/L (ref 3.5–5.3)
Sodium: 141 mmol/L (ref 135–146)
Total Bilirubin: 0.7 mg/dL (ref 0.2–1.2)
Total Protein: 7 g/dL (ref 6.1–8.1)

## 2015-12-08 LAB — TSH: TSH: 0.43 m[IU]/L

## 2015-12-09 LAB — VITAMIN D 25 HYDROXY (VIT D DEFICIENCY, FRACTURES): Vit D, 25-Hydroxy: 30 ng/mL (ref 30–100)

## 2015-12-12 ENCOUNTER — Ambulatory Visit (INDEPENDENT_AMBULATORY_CARE_PROVIDER_SITE_OTHER): Payer: BLUE CROSS/BLUE SHIELD | Admitting: Obstetrics & Gynecology

## 2015-12-12 ENCOUNTER — Encounter: Payer: Self-pay | Admitting: Obstetrics & Gynecology

## 2015-12-12 VITALS — BP 128/80 | HR 72 | Resp 14 | Ht 63.5 in | Wt 143.0 lb

## 2015-12-12 DIAGNOSIS — Z01419 Encounter for gynecological examination (general) (routine) without abnormal findings: Secondary | ICD-10-CM

## 2015-12-12 DIAGNOSIS — Z0000 Encounter for general adult medical examination without abnormal findings: Secondary | ICD-10-CM

## 2015-12-12 LAB — POCT URINALYSIS DIPSTICK
Bilirubin, UA: NEGATIVE
Glucose, UA: NEGATIVE
KETONES UA: NEGATIVE
Leukocytes, UA: NEGATIVE
Nitrite, UA: NEGATIVE
PROTEIN UA: NEGATIVE
RBC UA: NEGATIVE
Urobilinogen, UA: NEGATIVE
pH, UA: 5

## 2015-12-12 MED ORDER — ALPRAZOLAM 0.25 MG PO TABS: 0.2500 mg | ORAL_TABLET | Freq: Every evening | ORAL | Status: DC | PRN

## 2015-12-12 NOTE — Addendum Note (Signed)
Addended by: [name] on: 12/12/2015 02:04 PM   Modules accepted: Orders, SmartSet

## 2015-12-12 NOTE — Progress Notes (Addendum)
65 y.o. G4P3 WidowedCaucasianF here for annual exam.  Pt reports lots of stress with her parents.  Pt reports she's doing lots of care with them.  She's gained some weight.  Would like blood work today.  Denies vaginal bleeding.    Patient's last menstrual period was 07/28/2002.          Sexually active: No.  The current method of family planning is post menopausal status.    Exercising: Yes.    Walking Smoker:  no  Health Maintenance: Pap:  10/14/14 Neg. 09/14/12 Neg. HR HPV:neg History of abnormal Pap:  no MMG:  08/04/15 BIRADS1:neg Colonoscopy:  04/11/14 polyps -Repeat 5 years  BMD:  01/17/12 Osteoporosis  TDaP:  2010 Hep C: 10/16 Dr. Maudie Mercury.  Negative. Screening Labs: Done, Urine today: Negative    reports that she has never smoked. She has never used smokeless tobacco. She reports that she drinks about 1.2 oz of alcohol per week. She reports that she does not use illicit drugs.  Past Medical History  Diagnosis Date  . Nephrolithiasis   . Osteopenia   . Hyperlipidemia   . Tubular adenoma of colon 02/2009  . GERD (gastroesophageal reflux disease)   . IBS (irritable bowel syndrome)   . Allergy   . Vitamin D insufficiency     Past Surgical History  Procedure Laterality Date  . Tonsillectomy    . 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 calcium carbonate (OS-CAL) 600 MG TABS tablet Take 600 mg by mouth 2 (two) times daily with a meal.    . Cholecalciferol (VITAMIN D-3) 1000 UNITS CAPS Take by mouth.    . Multiple Vitamins-Minerals (MULTIVITAMIN PO) Take by mouth. gummies 2 daily     No current facility-administered medications for this visit.    Family History  Problem Relation Age of Onset  . Colon cancer Neg Hx   . Pancreatic cancer Neg Hx   . Rectal cancer Neg Hx   . Stomach cancer Neg Hx   . Atrial fibrillation Father   . Osteopenia Mother   . Heart disease Mother     a. fib  . High Cholesterol Mother    . Thyroid disease Daughter     thyroidectomy-nodules/cancer  . High Cholesterol Daughter     ROS:  Pertinent items are noted in HPI.  Otherwise, a comprehensive ROS was negative.  Exam:   BP 128/80 mmHg  Pulse 72  Resp 14  Ht 5' 3.5" (1.613 m)  Wt 143 lb (64.864 kg)  BMI 24.93 kg/m2  LMP 07/28/2002  Weight change: +4#  Height: 5' 3.5" (161.3 cm)  Ht Readings from Last 3 Encounters:  12/12/15 5' 3.5" (1.613 m)  07/10/15 5\' 3"  (1.6 m)  10/14/14 5' 3.75" (1.619 m)    General appearance: alert, cooperative and appears stated age Head: Normocephalic, without obvious abnormality, atraumatic Neck: no adenopathy, supple, symmetrical, trachea midline and thyroid normal to inspection and palpation Lungs: clear to auscultation bilaterally Breasts: normal appearance, no masses or tenderness Heart: regular rate and rhythm Abdomen: soft, non-tender; bowel sounds normal; no masses,  no organomegaly Extremities: extremities normal, atraumatic, no cyanosis or edema Skin: Skin color, texture, turgor normal. No rashes or lesions Lymph nodes: Cervical, supraclavicular, and axillary nodes normal. No abnormal inguinal nodes palpated Neurologic: Grossly normal   Pelvic: External genitalia:  no lesions              Urethra:  normal  appearing urethra with no masses, tenderness or lesions              Bartholins and Skenes: normal                 Vagina: normal appearing vagina with normal color and discharge, no lesions              Cervix: no lesions              Pap taken: No. Bimanual Exam:  Uterus:  normal size, contour, position, consistency, mobility, non-tender              Adnexa: normal adnexa and no mass, fullness, tenderness               Rectovaginal: Confirms               Anus:  normal sphincter tone, no lesions  Chaperone was present for exam.  A:  Well Woman with normal exam  PMP, no HRT Osteopenia Several family members with thyroid disease Stressors with care of  parents Anxiety about dental care  P: Mammogram yearly.  Pt did not do BMD with her last MMG.  States she forgot but will do this year.  pap smear with Neg HR HPV 10/13. Neg pap 2016.  No pap today. Lipids still elevated.  Will fax labs to Dr. Maudie Mercury. Xanax 0.25mg  prn anxiety #[id number]/0RF Return annually or prn

## 2015-12-17 NOTE — Addendum Note (Signed)
Addended by: [name] on: 12/17/2015 07:27 AM   Modules accepted: Miquel Dunn

## 2016-01-01 ENCOUNTER — Other Ambulatory Visit: Payer: Self-pay | Admitting: Unknown Physician Specialty

## 2016-01-01 ENCOUNTER — Ambulatory Visit
Admission: RE | Admit: 2016-01-01 | Discharge: 2016-01-01 | Disposition: A | Discharge status: Home / Self Care | Payer: BLUE CROSS/BLUE SHIELD | Source: Ambulatory Visit | Attending: Unknown Physician Specialty | Admitting: Unknown Physician Specialty

## 2016-01-01 DIAGNOSIS — R220 Localized swelling, mass and lump, head: Secondary | ICD-10-CM

## 2016-01-01 DIAGNOSIS — J019 Acute sinusitis, unspecified: Secondary | ICD-10-CM

## 2016-01-01 DIAGNOSIS — R221 Localized swelling, mass and lump, neck: Secondary | ICD-10-CM

## 2016-07-12 ENCOUNTER — Ambulatory Visit (INDEPENDENT_AMBULATORY_CARE_PROVIDER_SITE_OTHER): Payer: BLUE CROSS/BLUE SHIELD | Admitting: Orthopaedic Surgery

## 2016-07-27 ENCOUNTER — Ambulatory Visit (INDEPENDENT_AMBULATORY_CARE_PROVIDER_SITE_OTHER): Payer: Self-pay

## 2016-07-27 ENCOUNTER — Encounter (INDEPENDENT_AMBULATORY_CARE_PROVIDER_SITE_OTHER): Payer: Self-pay | Admitting: Orthopedic Surgery

## 2016-07-27 ENCOUNTER — Ambulatory Visit (INDEPENDENT_AMBULATORY_CARE_PROVIDER_SITE_OTHER): Payer: BLUE CROSS/BLUE SHIELD | Admitting: Orthopedic Surgery

## 2016-07-27 VITALS — BP 158/86 | HR 63 | Resp 12 | Ht 64.5 in | Wt 139.0 lb

## 2016-07-27 DIAGNOSIS — M25532 Pain in left wrist: Secondary | ICD-10-CM

## 2016-07-27 DIAGNOSIS — M25512 Pain in left shoulder: Secondary | ICD-10-CM | POA: Diagnosis not present

## 2016-07-27 DIAGNOSIS — G8929 Other chronic pain: Secondary | ICD-10-CM | POA: Diagnosis not present

## 2016-07-27 DIAGNOSIS — M25531 Pain in right wrist: Secondary | ICD-10-CM

## 2016-07-27 DIAGNOSIS — R2232 Localized swelling, mass and lump, left upper limb: Secondary | ICD-10-CM

## 2016-07-27 NOTE — Progress Notes (Signed)
Office Visit Note   Patient: Bonnie Moon           Date of Birth: [birth year]           MRN: [id number] Visit Date: 07/27/2016              Requested by: Bonnie Kern, DO [address] [place], [place] [address] PCP: Bonnie Moon., DO   Assessment & Plan: Visit Diagnoses:  1. Chronic left shoulder pain   2. Forearm mass, left   3. Pain in right wrist   4. Pain in left wrist     Plan: At this time she was not interested in a corticosteroid injection to the left shoulder.  In regards to her left wrist I had suggested her to see one of the hand surgeons in [place] further evaluation.  As for her left forearm we could certainly proceed with an MRI scan looking for the mass however she is not interested at this time.  Follow-Up Instructions: Return if symptoms worsen or fail to improve.   Orders:  Orders Placed This Encounter  Procedures  . XR Shoulder Left  . XR Forearm Left  . XR Wrist Complete Left  . Ambulatory referral to Physical Therapy   No orders of the defined types were placed in this encounter.     Procedures: No procedures performed   Clinical Data: No additional findings.   Subjective: Chief Complaint  Patient presents with  . Left Shoulder - Pain    ROM decreased, radiates down L arm, there is an egg-shaped hard spot on L forearm noticed 2 months ago.    Bonnie Moon is 65 year old white female who is seen today for evaluation of multiple problems in regards to her left arm. 2 months ago she started having pain in the left shoulder area and down into the upper humeral area. Pain was so bad in December she was unable to move her shoulder. However she is now improved from that but still has intermittently decreased range of motion of the shoulder. This at times can be bothersome.  She is also noted a small mass in her mid forearm though it is not painful he is concerned. She is not really sure how long it has been there. Denies any recent history  of injury or trauma to that area. In regards to her left hand and wrist she has been having significant problems with the the wrist especially at the radial aspect. Also noted a deformity in the wrist with elevation dorsally and the wrist area. She has pain with the pronation supination as well as flexion and extension worse at the radial aspect. She is now limited secondary to her pain and discomfort.  Seen today for evaluation.    Review of Systems  Genitourinary:       History of renal calculi  All other systems reviewed and are negative.    Objective: Vital Signs: BP (!) 158/86 (BP Location: Right Arm)   Pulse 63   Resp 12   Ht 5' 4.5" (1.638 m)   Wt 139 lb (63 kg)   LMP 07/28/2002   BMI 23.49 kg/m   Physical Exam  Constitutional: She is oriented to person, place, and time. She appears well-developed and well-nourished.  HENT:  Head: Normocephalic and atraumatic.  Eyes: EOM are normal. Pupils are equal, round, and reactive to light.  Neck: Neck supple.  No carotid bruits  Cardiovascular: Normal rate and regular rhythm.   Pulmonary/Chest: Effort  normal.  Abdominal: Soft.  Neurological: She is alert and oriented to person, place, and time.  Skin: Skin is warm and dry.  Psychiatric: She has a normal mood and affect. Her behavior is normal. Judgment and thought content normal.    Left forearm exam There is a small palpable swelling in the midportion of the lateral aspect of the right forearm. This is nontender. It is somewhat mobile. No erythema is noted.  Left Hand Exam   Tenderness  The patient is experiencing tenderness in the dorsal area, palmer area, radial area and snuff box.   Range of Motion   Wrist  Extension: abnormal  Flexion: abnormal  Pronation: abnormal  Supination: abnormal   Muscle Strength  Wrist Extension: 3/5  Wrist Flexion: 3/5  Grip:  3/5   Other  Sensation: normal Pulse: present   Left Shoulder Exam   Tenderness  The patient is  experiencing tenderness in the acromioclavicular joint.  Range of Motion  Active Abduction: 130  Forward Flexion: 140  External Rotation: 50  Internal Rotation 90 degrees: 40   Muscle Strength  Abduction: 4/5  Internal Rotation: 5/5  External Rotation: 4/5  Supraspinatus: 4/5   Other  Sensation: normal       Specialty Comments:  No specialty comments available.  Imaging: No results found.   PMFS History: Patient Active Problem List   Diagnosis Date Noted  . Unspecified vitamin D deficiency 01/26/2013  . Allergic rhinitis 01/26/2013  . Other and unspecified hyperlipidemia 01/26/2013  . History of repair of hip joint 01/17/2012  . OSTEOPENIA 06/26/2007   Past Medical History:  Diagnosis Date  . Allergy   . GERD (gastroesophageal reflux disease)   . Hyperlipidemia   . IBS (irritable bowel syndrome)   . Nephrolithiasis   . Osteopenia   . Tubular adenoma of colon 02/2009  . Vitamin D insufficiency     Family History  Problem Relation Age of Onset  . Colon cancer Neg Hx   . Pancreatic cancer Neg Hx   . Rectal cancer Neg Hx   . Stomach cancer Neg Hx   . Atrial fibrillation Father   . Osteopenia Mother   . Heart disease Mother     a. fib  . High Cholesterol Mother   . Thyroid disease Daughter     thyroidectomy-nodules/cancer  . High Cholesterol Daughter     Past Surgical History:  Procedure Laterality Date  . TONSILLECTOMY    . TOTAL HIP ARTHROPLASTY  05/2008   Social History   Occupational History  . Self-employed     Event and Meeting Planning   Social History Main Topics  . Smoking status: Never Smoker  . Smokeless tobacco: Never Used  . Alcohol use 1.2 oz/week    2 Standard drinks or equivalent per week     Comment: 1-2 drinks occ  . Drug use: No  . Sexual activity: No

## 2016-08-10 ENCOUNTER — Encounter: Payer: Self-pay | Admitting: Obstetrics & Gynecology

## 2016-08-17 ENCOUNTER — Telehealth: Payer: Self-pay | Admitting: *Deleted

## 2016-08-17 NOTE — Telephone Encounter (Signed)
Message left to return call to Emily at [phone] to review BMD results.  

## 2016-08-17 NOTE — Telephone Encounter (Signed)
Patient returned call. Notified of BMD results and she verbalized understanding. Consult scheduled for Monday 08/30/16 at 1500. Patient agreeable to date and time of appointment.  Routing to provider for final review. Patient agreeable to disposition. Will close encounter.

## 2016-08-30 ENCOUNTER — Ambulatory Visit (INDEPENDENT_AMBULATORY_CARE_PROVIDER_SITE_OTHER): Payer: BLUE CROSS/BLUE SHIELD | Admitting: Obstetrics & Gynecology

## 2016-08-30 ENCOUNTER — Encounter: Payer: Self-pay | Admitting: Obstetrics & Gynecology

## 2016-08-30 VITALS — BP 128/86 | HR 68 | Resp 14 | Ht 63.0 in | Wt 141.4 lb

## 2016-08-30 DIAGNOSIS — M25532 Pain in left wrist: Secondary | ICD-10-CM | POA: Diagnosis not present

## 2016-08-30 DIAGNOSIS — S62102A Fracture of unspecified carpal bone, left wrist, initial encounter for closed fracture: Secondary | ICD-10-CM | POA: Diagnosis not present

## 2016-08-30 DIAGNOSIS — M810 Age-related osteoporosis without current pathological fracture: Secondary | ICD-10-CM | POA: Diagnosis not present

## 2016-08-30 NOTE — Progress Notes (Signed)
Subjective:    42 yrs Widowed Caucasian G4P3  female here to discuss recent BMD obtained 08/04/16 showing osteoporosis with T score -2.9 (changed from -2.4 01/17/12) in femoral neck.  Also, there was change in her spine from T score of -2.1 to -2.6.  Pt recently saw Angelene Giovanni at [facility] who did a shoulder x-ray and hand x-ray due to pain and swelling issues in these joints.  She has some bone spurs in her shoulder and a fractured wrist bone vs AVN in this bone.  She reports he told her that "her wrist was toast" and that she need to see a hand specialist.  He offered no names or recommendations other than "we don't have one of those here".  She was very frustrated with this visit.  Asks "what does that mean--my wrist is toast??"  She has x-ray pictures with here and there is clearly a bone abnormality in the x-ray but I do not read wrist x rays and really am not qualified to make a recommendation about this.  In regards to this and her recent osteoporosis diagnosis, we need to evaluate her for secondary causes of osteoporosis and she does need to see and orthopedist who can help with the diagnosis in her hand before considering any treatment options.  She would like to go to [place] and see Dr. Amedeo Plenty.  She would like me to handle the referral.  Medication options briefly discussed.  Pt would prefer IV therapy due to concerns regarding reflux.  She has already done some research.  Questions answered.  Again, feel hand issues and blood work needed before proceeding with treatment recommendations.   Osteoporosis Risk Factors  Nonmodifiable Personal Hx of fracture as an adult: yes - recently with hand x ray but pt doesn't know how this happened Caucasian race: yes Advanced age: no Female sex: yes Dementia: no Poor health/frailty: no  Potentially modifiable: Tobacco use: no Low body weight (<127 lbs): no Estrogen deficiency  early menopause (age <45) or bilateral ovariectomy: no   prolonged premenopausal amenorrhea (>1 yr): no Low calcium intake (lifelong): yes, does not eat dairy products much Alcohol use more than 2 drinks per day: no Recurrent falls: no Inadequate physical activity: no  Current calcium and Vit D intake:  1200mg  calcium with VIt D  Review of Systems Pertinent items noted in HPI and remainder of comprehensive ROS otherwise negative.     Objective:   PHYSICAL EXAM BP 128/86 (BP Location: Right Arm, Patient Position: Sitting, Cuff Size: Normal)   Pulse 68   Resp 14   Ht 5\' 3"  (1.6 m)   Wt 141 lb 6.4 oz (64.1 kg)   LMP 07/28/2002   BMI 25.05 kg/m  General appearance: alert, cooperative, appears stated age and no distress  Imaging Bone Density: Spine T Score: -2.6, Hip T Score: -2.9   Done on 08/04/16 FRAX score:  Not calculated.                                    Assessment:   Osteoporosis and possible bone fracture in wrist vs AVN  Plan:   1.  Pt has had CMP, TSH, Vit D fairly recently.  PTH with intact calcium will be obtained 2.  May need to do 24 hour urine for calcium.  Container for collection given 3.  Referral to [facility] placed 4.  If treatment is appropriate, pt would  like to consider Reclast  ~25 minutes spent with patient >50% of time was in face to face discussion of above.

## 2016-08-31 LAB — PTH, INTACT AND CALCIUM
Calcium: 10.4 mg/dL (ref 8.6–10.4)
PTH: 114 pg/mL — AB (ref 14–64)

## 2016-09-01 ENCOUNTER — Other Ambulatory Visit: Payer: Self-pay | Admitting: Obstetrics & Gynecology

## 2016-09-01 ENCOUNTER — Telehealth: Payer: Self-pay | Admitting: *Deleted

## 2016-09-01 DIAGNOSIS — R7989 Other specified abnormal findings of blood chemistry: Secondary | ICD-10-CM

## 2016-09-01 DIAGNOSIS — M8080XA Other osteoporosis with current pathological fracture, unspecified site, initial encounter for fracture: Secondary | ICD-10-CM

## 2016-09-01 NOTE — Telephone Encounter (Signed)
Patient returning call. Patient states she spoke with daughter and she would like to be referred to Dr. Clydie Braun at [facility]. [phone]. Patient states she only wants to see Dr. Shawna Orleans. Patient would alos like Dr. Sabra Heck to know she has a dental appt tomorrow between 12-1:30, no call during this time.  Routing to provider for final review. Patient is agreeable to disposition. Will close encounter.   Cc: Theresia Lo

## 2016-09-01 NOTE — Telephone Encounter (Signed)
-----   Message from [name], MD sent at 09/01/2016  6:46 AM EST ----- Please let pt know her PTH was elevated.  Normal range is 14-64.  Her level was 114.  I've placed a referral for her to see endocrinology.  She was recently diagnosed with a fractured bone in her hand and will be seeing ortho for this.  As well, she has ostoeporosis and we were discussing treatment options.  She is aware this will need to be managed first.  Thanks.

## 2016-09-01 NOTE — Telephone Encounter (Signed)
Spoke with patient, advised of results as seen below per Dr. Sabra Heck. Patient states her daughter sees a endocrinologist in [place] that she may want to see as well. Patient states she would like to discuss with daughter and return call with name of physician. Patient states she wanted to confirm with Dr. Sabra Heck not needing to do a 24 hr urine at this point? Advised patient Calcium was normal, no further recommendations noted. Patient states she had a lengthy discussion with Dr. Sabra Heck in office and has just a few additional questions for her and would like for Dr. Sabra Heck to return a call. Advised patient Dr. Sabra Heck is out of the office today, will send message for her to review when she return 12/7. Advised patient response may not be immediate. Patient verbalizes understanding and is agreeable.  Dr. Sabra Heck -patient request return call.  Cc: Theresia Lo

## 2016-09-02 ENCOUNTER — Other Ambulatory Visit: Payer: Self-pay | Admitting: Obstetrics & Gynecology

## 2016-09-02 ENCOUNTER — Telehealth: Payer: Self-pay | Admitting: Obstetrics & Gynecology

## 2016-09-02 DIAGNOSIS — R7989 Other specified abnormal findings of blood chemistry: Secondary | ICD-10-CM

## 2016-09-02 NOTE — Telephone Encounter (Signed)
Patient called to ask referral coordinator to try and schedule her with Dr. Shawna Orleans. (See last telephone note.)  Her daughter has an appointment on 10/14/15 at 2:00 and she'd like to go that same day.  She said the receptionist at Dr. Lora Havens told her daughter they do have appointments available that day.  Routing to [facility] for scheduling.

## 2016-09-02 NOTE — Telephone Encounter (Signed)
Order placed for referral to Dr. Shawna Orleans.  Can remove any other endocrinology referrals.  Am sending this to [facility] as well. Thanks.

## 2016-09-06 NOTE — Telephone Encounter (Signed)
Spoke with patient. She is aware referral sent 09/03/16 and in review process by Dr Lora Havens office. Will follow up with office today and inform patient further status as requested. No further questions. Ok to close.

## 2016-11-16 DIAGNOSIS — E210 Primary hyperparathyroidism: Secondary | ICD-10-CM | POA: Insufficient documentation

## 2016-11-16 DIAGNOSIS — E042 Nontoxic multinodular goiter: Secondary | ICD-10-CM | POA: Insufficient documentation

## 2016-12-28 ENCOUNTER — Ambulatory Visit (INDEPENDENT_AMBULATORY_CARE_PROVIDER_SITE_OTHER): Payer: BLUE CROSS/BLUE SHIELD | Admitting: Obstetrics & Gynecology

## 2016-12-28 ENCOUNTER — Other Ambulatory Visit (HOSPITAL_COMMUNITY)
Admission: RE | Admit: 2016-12-28 | Discharge: 2016-12-28 | Disposition: A | Discharge status: Home / Self Care | Payer: BLUE CROSS/BLUE SHIELD | Source: Ambulatory Visit | Attending: Obstetrics & Gynecology | Admitting: Obstetrics & Gynecology

## 2016-12-28 ENCOUNTER — Encounter: Payer: Self-pay | Admitting: Obstetrics & Gynecology

## 2016-12-28 VITALS — BP 120/78 | HR 64 | Resp 12 | Ht 63.5 in | Wt 145.6 lb

## 2016-12-28 DIAGNOSIS — D352 Benign neoplasm of pituitary gland: Secondary | ICD-10-CM

## 2016-12-28 DIAGNOSIS — Z124 Encounter for screening for malignant neoplasm of cervix: Secondary | ICD-10-CM | POA: Diagnosis not present

## 2016-12-28 DIAGNOSIS — F411 Generalized anxiety disorder: Secondary | ICD-10-CM

## 2016-12-28 DIAGNOSIS — Z01419 Encounter for gynecological examination (general) (routine) without abnormal findings: Secondary | ICD-10-CM

## 2016-12-28 DIAGNOSIS — Z1151 Encounter for screening for human papillomavirus (HPV): Secondary | ICD-10-CM | POA: Insufficient documentation

## 2016-12-28 MED ORDER — DULOXETINE HCL 20 MG PO CPEP: 20.0000 mg | ORAL_CAPSULE | Freq: Every day | ORAL | 1 refills | Status: DC

## 2016-12-28 NOTE — Progress Notes (Signed)
66 y.o. G4P3 WidowedCaucasianF here for annual exam.  Having parathyroidectomy due to elevated PTH.  Has undergone three ultrasounds.  Also did a nuclear test showing the lower low parathyroid glands likely have adenomas.  Was recommended to have a CT scan.  Has declined doing this.  Offered second opinion to pt.  She feels she is "too far along" to start with anyone else.  Admits this current medical problem has really increased her anxiety but she has anxiety almost all the time.  Her daughter recently commented on this to her.  She has used a little Xanax.  Admits she doesn't like to take anything.  OTC and medical options discussed.  Another issue pt is having is joint pain and difficulty sleeping.  D/w pt use of cymbalta for both of these.  She is interested in trying but advised she can't just pick and choose the days she's going to take it.  She has to take it regularly.  Admits she will try.  Patient's last menstrual period was 07/28/2002.          Sexually active: No.  The current method of family planning is post menopausal status.    Exercising: No.  The patient does not participate in regular exercise at present. Smoker:  no  Health Maintenance: Pap:  10/14/14 WNL; 09/14/12 WNL neg HR HPV History of abnormal Pap:  no MMG:  08/04/16 BIRADS1, Density B, [facility] Colonoscopy:  04/11/14 Polyps. Repeat 5 years BMD:   08/04/16 Osteoporosis -2.9 TDaP: 2010 Pneumonia vaccine(s):  No Zostavax:   No Hep C testing: 10/16 Dr. Maudie Mercury, neg Screening Labs: endocrinologist does   reports that she has never smoked. She has never used smokeless tobacco. She reports that she drinks about 1.2 oz of alcohol per week . She reports that she does not use drugs.  Past Medical History:  Diagnosis Date  . Allergy   . GERD (gastroesophageal reflux disease)   . Hyperlipidemia   . IBS (irritable bowel syndrome)   . Nephrolithiasis   . Osteopenia   . Tubular adenoma of colon 02/2009  . Vitamin D insufficiency      Past Surgical History:  Procedure Laterality Date  . TONSILLECTOMY    . TOTAL HIP ARTHROPLASTY  05/2008    Current Outpatient Prescriptions  Medication Sig Dispense Refill  . Cholecalciferol (VITAMIN D-3) 1000 UNITS CAPS Take 5,000 Units by mouth.     . Multiple Vitamins-Minerals (MULTIVITAMIN PO) Take by mouth. gummies 2 daily     No current facility-administered medications for this visit.     Family History  Problem Relation Age of Onset  . Colon cancer Neg Hx   . Pancreatic cancer Neg Hx   . Rectal cancer Neg Hx   . Stomach cancer Neg Hx   . Atrial fibrillation Father   . Osteopenia Mother   . Heart disease Mother     a. fib  . High Cholesterol Mother   . Thyroid disease Daughter     thyroidectomy-nodules/cancer  . High Cholesterol Daughter     ROS:  Pertinent items are noted in HPI.  Otherwise, a comprehensive ROS was negative.  Exam:   BP 120/78 (BP Location: Right Arm, Patient Position: Sitting, Cuff Size: Normal)   Pulse 64   Resp 12   Ht 5' 3.5" (1.613 m)   Wt 145 lb 9.6 oz (66 kg)   LMP 07/28/2002   BMI 25.39 kg/m   Weight change: -2#   Height: 5' 3.5" (161.3 cm)  Ht Readings from Last 3 Encounters:  12/28/16 5' 3.5" (1.613 m)  08/30/16 5\' 3"  (1.6 m)  07/27/16 5' 4.5" (1.638 m)    General appearance: alert, cooperative and appears stated age Head: Normocephalic, without obvious abnormality, atraumatic Neck: no adenopathy, supple, symmetrical, trachea midline and thyroid normal to inspection and palpation Lungs: clear to auscultation bilaterally Breasts: normal appearance, no masses or tenderness Heart: regular rate and rhythm Abdomen: soft, non-tender; bowel sounds normal; no masses,  no organomegaly Extremities: extremities normal, atraumatic, no cyanosis or edema Skin: Skin color, texture, turgor normal. No rashes or lesions Lymph nodes: Cervical, supraclavicular, and axillary nodes normal. No abnormal inguinal nodes palpated Neurologic:  Grossly normal   Pelvic: External genitalia:  no lesions              Urethra:  normal appearing urethra with no masses, tenderness or lesions              Bartholins and Skenes: normal                 Vagina: normal appearing vagina with normal color and discharge, no lesions              Cervix: no lesions              Pap taken: Yes.   Bimanual Exam:  Uterus:  normal size, contour, position, consistency, mobility, non-tender              Adnexa: no mass, fullness, tenderness               Rectovaginal: Confirms               Anus:  normal sphincter tone, no lesions  Chaperone was present for exam.  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 pap smear and HR HPV obtained today Declines blood work today D/W pt treatment for anxiety.  Will start with cymbalta 20mg  daily.  #30/1RF.  Side effects, risks, benefits reviewed.  Pt will give update in month. Return annually or prn   ~In addition to routine gyn exam, additional 15 minutes spent in discussion of pituitary adenoma, anxiety, and treatment options.  inutes spent with patient >50% of time was in face to face discussion of above.

## 2016-12-28 NOTE — Patient Instructions (Addendum)
Shringrx--the new vaccine for singles.  Two shots.

## 2016-12-30 LAB — CYTOLOGY - PAP
Diagnosis: NEGATIVE
HPV (WINDOPATH): NOT DETECTED

## 2017-01-17 HISTORY — PX: THYROID SURGERY: SHX805

## 2017-01-17 HISTORY — PX: PARATHYROIDECTOMY: SHX19

## 2017-02-01 DIAGNOSIS — E892 Postprocedural hypoparathyroidism: Secondary | ICD-10-CM | POA: Insufficient documentation

## 2017-02-01 DIAGNOSIS — Z9889 Other specified postprocedural states: Secondary | ICD-10-CM | POA: Insufficient documentation

## 2017-03-01 ENCOUNTER — Ambulatory Visit: Payer: BLUE CROSS/BLUE SHIELD | Admitting: Obstetrics & Gynecology

## 2017-03-25 ENCOUNTER — Ambulatory Visit: Payer: BLUE CROSS/BLUE SHIELD | Admitting: Obstetrics & Gynecology

## 2017-04-07 ENCOUNTER — Encounter: Payer: Self-pay | Admitting: Obstetrics & Gynecology

## 2017-04-07 ENCOUNTER — Ambulatory Visit (INDEPENDENT_AMBULATORY_CARE_PROVIDER_SITE_OTHER): Payer: BLUE CROSS/BLUE SHIELD | Admitting: Obstetrics & Gynecology

## 2017-04-07 VITALS — BP 132/78 | HR 66 | Resp 12 | Ht 64.0 in | Wt 145.2 lb

## 2017-04-07 DIAGNOSIS — M818 Other osteoporosis without current pathological fracture: Secondary | ICD-10-CM

## 2017-04-07 DIAGNOSIS — E210 Primary hyperparathyroidism: Secondary | ICD-10-CM | POA: Diagnosis not present

## 2017-04-07 DIAGNOSIS — D352 Benign neoplasm of pituitary gland: Secondary | ICD-10-CM

## 2017-04-07 NOTE — Progress Notes (Signed)
GYNECOLOGY  VISIT   HPI: 66 y.o. G4P3 Widowed Caucasian female here for follow-up after being prescribed Cymbalta.  She only took one tablet but stopped due to nausea.  Pt has undergone parathyroidectomy since I saw her last.  She reports she is feeling very well and is having much less issue with anxiety and mood changes.  Feels a lot of this was just due to "getting ready for surgery".  Having follow up in [place] with endocrinologist.  Pt wants to discuss recent BMD done 11/17.  This showed -2.9 in right femur.  Prolia has been recommended.  She wants to wait until BMD due next year to see if BMD is improved before starting medication.  Very anxious about possible side effects and the 6 month time frame with dosing.  She is worried if she had a side effect, how would she deal with side effects for that period of time.  She is taking some calcium and Vit D.  Feel this is reasonable however, I am aware that her endocrinologist has recommended medications.  GYNECOLOGIC HISTORY: Patient's last menstrual period was 07/28/2002. Contraception: PMP   Patient Active Problem List   Diagnosis Date Noted  . 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 Hyperparathyroidism, primary ([place]) 11/16/2016  . Right thyroid nodule 11/16/2016  . Unspecified vitamin D deficiency 01/26/2013  . Allergic rhinitis 01/26/2013  . Other and unspecified hyperlipidemia 01/26/2013  . Hip pain 01/31/2012  . History of repair of hip joint 01/17/2012  . Osteoarthritis of left hip 01/17/2012  . OSTEOPENIA 06/26/2007    Past Medical History:  Diagnosis Date  . Allergy   . GERD (gastroesophageal reflux disease)   . Hyperlipidemia   . IBS (irritable bowel syndrome)   . Nephrolithiasis   . Osteopenia   . Tubular adenoma of colon 02/2009  . Vitamin D insufficiency     Past Surgical History:  Procedure Laterality Date  . THYROID SURGERY  01/17/2017   done by Dr. Oran Rein- at [facility] in [place]   . TONSILLECTOMY    . TOTAL  HIP ARTHROPLASTY  05/2008    MEDS:  Reviewed in EPIC and UTD  ALLERGIES: Patient has no known allergies.  Family History  Problem Relation Age of Onset  . Atrial fibrillation Father   . Osteopenia Mother   . Heart disease Mother        a. fib  . High Cholesterol Mother   . Thyroid disease Daughter        thyroidectomy-nodules/cancer  . High Cholesterol Daughter   . Colon cancer Neg Hx   . Pancreatic cancer Neg Hx   . Rectal cancer Neg Hx   . Stomach cancer Neg Hx     SH:  Married, non smoker  Review of Systems  All other systems reviewed and are negative.   PHYSICAL EXAMINATION:    BP 132/78 (BP Location: Right Arm, Patient Position: Sitting, Cuff Size: Normal)   Pulse 66   Resp 12   Ht 5\' 4"  (1.626 m)   Wt 145 lb 4 oz (65.9 kg)   LMP 07/28/2002   BMI 24.93 kg/m     General appearance: alert, cooperative and appears stated age Neck: no adenopathy, supple, symmetrical, trachea midline and thyroid normal to inspection and palpation, well healed scars  Assessment: Osteoporosis likely due to hyperparathyroidism S/p parathyroidectomy  Plan: BMD 11/19.  Continue supplements and follow-up with endocrinology.     ~15 minutes spent with patient >50% of time was in face to face  discussion of above.

## 2017-09-16 ENCOUNTER — Encounter: Payer: Self-pay | Admitting: Obstetrics & Gynecology

## 2017-11-09 DIAGNOSIS — M810 Age-related osteoporosis without current pathological fracture: Secondary | ICD-10-CM | POA: Diagnosis not present

## 2017-11-09 DIAGNOSIS — E559 Vitamin D deficiency, unspecified: Secondary | ICD-10-CM | POA: Diagnosis not present

## 2017-11-09 DIAGNOSIS — E042 Nontoxic multinodular goiter: Secondary | ICD-10-CM | POA: Diagnosis not present

## 2017-11-09 DIAGNOSIS — E785 Hyperlipidemia, unspecified: Secondary | ICD-10-CM | POA: Diagnosis not present

## 2017-11-09 DIAGNOSIS — E210 Primary hyperparathyroidism: Secondary | ICD-10-CM | POA: Diagnosis not present

## 2017-11-09 DIAGNOSIS — R739 Hyperglycemia, unspecified: Secondary | ICD-10-CM | POA: Diagnosis not present

## 2017-11-09 DIAGNOSIS — I10 Essential (primary) hypertension: Secondary | ICD-10-CM | POA: Diagnosis not present

## 2017-11-17 DIAGNOSIS — E042 Nontoxic multinodular goiter: Secondary | ICD-10-CM | POA: Diagnosis not present

## 2017-11-17 DIAGNOSIS — E559 Vitamin D deficiency, unspecified: Secondary | ICD-10-CM | POA: Diagnosis not present

## 2017-11-17 DIAGNOSIS — M810 Age-related osteoporosis without current pathological fracture: Secondary | ICD-10-CM | POA: Diagnosis not present

## 2017-11-17 DIAGNOSIS — E210 Primary hyperparathyroidism: Secondary | ICD-10-CM | POA: Diagnosis not present

## 2017-11-17 DIAGNOSIS — E785 Hyperlipidemia, unspecified: Secondary | ICD-10-CM | POA: Diagnosis not present

## 2017-11-17 DIAGNOSIS — I10 Essential (primary) hypertension: Secondary | ICD-10-CM | POA: Diagnosis not present

## 2018-02-27 DIAGNOSIS — R739 Hyperglycemia, unspecified: Secondary | ICD-10-CM | POA: Diagnosis not present

## 2018-02-27 DIAGNOSIS — E210 Primary hyperparathyroidism: Secondary | ICD-10-CM | POA: Diagnosis not present

## 2018-02-27 DIAGNOSIS — E559 Vitamin D deficiency, unspecified: Secondary | ICD-10-CM | POA: Diagnosis not present

## 2018-02-27 DIAGNOSIS — E785 Hyperlipidemia, unspecified: Secondary | ICD-10-CM | POA: Diagnosis not present

## 2018-02-27 DIAGNOSIS — M810 Age-related osteoporosis without current pathological fracture: Secondary | ICD-10-CM | POA: Diagnosis not present

## 2018-02-27 DIAGNOSIS — I10 Essential (primary) hypertension: Secondary | ICD-10-CM | POA: Diagnosis not present

## 2018-03-07 DIAGNOSIS — R739 Hyperglycemia, unspecified: Secondary | ICD-10-CM | POA: Diagnosis not present

## 2018-03-07 DIAGNOSIS — M810 Age-related osteoporosis without current pathological fracture: Secondary | ICD-10-CM | POA: Diagnosis not present

## 2018-03-07 DIAGNOSIS — E042 Nontoxic multinodular goiter: Secondary | ICD-10-CM | POA: Diagnosis not present

## 2018-03-07 DIAGNOSIS — E210 Primary hyperparathyroidism: Secondary | ICD-10-CM | POA: Diagnosis not present

## 2018-03-07 DIAGNOSIS — I10 Essential (primary) hypertension: Secondary | ICD-10-CM | POA: Diagnosis not present

## 2018-03-07 DIAGNOSIS — E785 Hyperlipidemia, unspecified: Secondary | ICD-10-CM | POA: Diagnosis not present

## 2018-03-07 DIAGNOSIS — E559 Vitamin D deficiency, unspecified: Secondary | ICD-10-CM | POA: Diagnosis not present

## 2018-03-17 ENCOUNTER — Encounter

## 2018-03-17 ENCOUNTER — Encounter: Payer: Self-pay | Admitting: Obstetrics & Gynecology

## 2018-03-17 ENCOUNTER — Other Ambulatory Visit: Payer: Self-pay

## 2018-03-17 ENCOUNTER — Ambulatory Visit: Payer: Medicare Other | Admitting: Obstetrics & Gynecology

## 2018-03-17 VITALS — BP 126/80 | HR 64 | Resp 14 | Ht 63.0 in | Wt 146.6 lb

## 2018-03-17 DIAGNOSIS — Z01419 Encounter for gynecological examination (general) (routine) without abnormal findings: Secondary | ICD-10-CM

## 2018-03-17 DIAGNOSIS — Z124 Encounter for screening for malignant neoplasm of cervix: Secondary | ICD-10-CM

## 2018-03-17 NOTE — Progress Notes (Signed)
67 y.o. G4P3 WidowedCaucasianF here for annual exam.  H/o hyperparathyroidism with resection of parathyroid gland where adenoma was present.  Has new thyroid nodule and having repeat PUS in six months.  Followed by Dr. Shawna Orleans.  Blood work was normal a couple of weeks ago.  Was placed on a statin earlier this year.  Reports Vit D level was great.    Denies vaginal bleeding.    Patient's last menstrual period was 07/28/2002.          Sexually active: No.  The current method of family planning is post menopausal status.    Exercising: Yes.    walking, gym Smoker:  no  Health Maintenance: Pap:  12/28/16 Neg. HR HPV:neg   10/14/14 neg  History of abnormal Pap:  no MMG:  08/15/17 BIRADS2:benign  Colonoscopy:  04/11/14 normal. F/u 5 years  BMD:   08/04/16  TDaP:  2010 Pneumonia vaccine(s):  done Shingrix:   No Hep C testing: unsure Screening Labs: Endocrinology    reports that she has never smoked. She has never used smokeless tobacco. She reports that she drinks about 1.2 oz of alcohol per week. She reports that she does not use drugs.  Past Medical History:  Diagnosis Date  . Allergy   . GERD (gastroesophageal reflux disease)   . Hyperlipidemia   . IBS (irritable bowel syndrome)   . Nephrolithiasis   . Osteopenia   . Thyroid nodule   . Tubular adenoma of colon 02/2009  . Vitamin D insufficiency     Past Surgical History:  Procedure Laterality Date  . THYROID SURGERY  01/17/2017   done by Dr. Oran Rein- at [facility] in [place]   . TONSILLECTOMY    . TOTAL HIP ARTHROPLASTY  05/2008    Current Outpatient Medications  Medication Sig Dispense Refill  . atorvastatin (LIPITOR) 20 MG tablet Take 20 mg by mouth daily.  3  . Cholecalciferol (VITAMIN D-3) 1000 UNITS CAPS Take 5,000 Units by mouth.     . Multiple Vitamins-Minerals (MULTIVITAMIN PO) Take by mouth. gummies 2 daily    . Calcium Citrate-Vitamin D 315-250 MG-UNIT TABS Take by mouth.     No current facility-administered medications for  this visit.     Family History  Problem Relation Age of Onset  . Atrial fibrillation Father   . Osteopenia Mother   . Heart disease Mother        a. fib  . High Cholesterol Mother   . Thyroid disease Daughter        thyroidectomy-nodules/cancer  . High Cholesterol Daughter   . Colon cancer Neg Hx   . Pancreatic cancer Neg Hx   . Rectal cancer Neg Hx   . Stomach cancer Neg Hx     Review of Systems  All other systems reviewed and are negative.   Exam:   BP 126/80 (BP Location: Right Arm, Patient Position: Sitting, Cuff Size: Normal)   Pulse 64   Resp 14   Ht 5\' 3"  (1.6 m)   Wt 146 lb 9.6 oz (66.5 kg)   LMP 07/28/2002   BMI 25.97 kg/m     Height: 5\' 3"  (160 cm)  Ht Readings from Last 3 Encounters:  03/17/18 5\' 3"  (1.6 m)  04/07/17 5\' 4"  (1.626 m)  12/28/16 5' 3.5" (1.613 m)    General appearance: alert, cooperative and appears stated age Head: Normocephalic, without obvious abnormality, atraumatic Neck: no adenopathy, supple, symmetrical, trachea midline and thyroid normal to inspection and palpation Lungs: clear to  auscultation bilaterally Breasts: normal appearance, no masses or tenderness Heart: regular rate and rhythm Abdomen: soft, non-tender; bowel sounds normal; no masses,  no organomegaly Extremities: extremities normal, atraumatic, no cyanosis or edema Skin: Skin color, texture, turgor normal. No rashes or lesions Lymph nodes: Cervical, supraclavicular, and axillary nodes normal. No abnormal inguinal nodes palpated Neurologic: Grossly normal   Pelvic: External genitalia:  no lesions              Urethra:  normal appearing urethra with no masses, tenderness or lesions              Bartholins and Skenes: normal                 Vagina: normal appearing vagina with normal color and discharge, no lesions              Cervix: no lesions              Pap taken: No. Bimanual Exam:  Uterus:  normal size, contour, position, consistency, mobility, non-tender               Adnexa: normal adnexa and no mass, fullness, tenderness               Rectovaginal: Confirms               Anus:  normal sphincter tone, no lesions  Chaperone was present for exam.  A:  Well Woman with normal exam PMP, no HRT H/o hyperparathyroidism, s/p resection of 3 1/2 para thyroid glands Osteoporosis Thyroid nodules  P:   Mammogram guidelines reviewed.  Pap smear with neg HR HPV 2018.  Not indicated today Blood work it UTD Schedule BMD with MMG D/w pt shingles vaccine Release of records will be signed from Dr. Shawna Orleans. Return annually or prn

## 2018-08-01 ENCOUNTER — Telehealth: Payer: Self-pay | Admitting: Obstetrics & Gynecology

## 2018-08-01 NOTE — Telephone Encounter (Signed)
Spoke with patient.   1. Last BMD at [facility] on 08/04/16: Osteoporosis, requesting BMD order. Advised once reviewed and signed by Dr. Sabra Heck, I will fax order for BMD to [facility].  2. Last colonoscopy 04/11/14 with Dr. Fuller Plan, normal. Offered assistance with scheduling, patient will call directly to schedule.   Patient verbalizes understanding.   Written order for BMD to Dr. Sabra Heck for signature.

## 2018-08-01 NOTE — Telephone Encounter (Signed)
1. Patient called and said she has a breast recheck and bone density scheduled at [facility] on 08/16/18 11:00 AM. She said they'll need an order for the bone density test.  2. Patient said she is due to see Dr. Fuller Plan on [address] next year for a colonoscopy next year. She said she'll need some help getting this taken care of as well.

## 2018-08-02 NOTE — Telephone Encounter (Signed)
BMD order faxed to [facility].   Encounter closed.

## 2018-08-07 DIAGNOSIS — G8929 Other chronic pain: Secondary | ICD-10-CM | POA: Diagnosis not present

## 2018-08-07 DIAGNOSIS — Z96642 Presence of left artificial hip joint: Secondary | ICD-10-CM | POA: Diagnosis not present

## 2018-08-07 DIAGNOSIS — M1611 Unilateral primary osteoarthritis, right hip: Secondary | ICD-10-CM | POA: Diagnosis not present

## 2018-08-07 DIAGNOSIS — M25551 Pain in right hip: Secondary | ICD-10-CM | POA: Diagnosis not present

## 2018-08-07 DIAGNOSIS — M25552 Pain in left hip: Secondary | ICD-10-CM | POA: Diagnosis not present

## 2018-08-15 ENCOUNTER — Encounter: Payer: Self-pay | Admitting: Family Medicine

## 2018-08-15 ENCOUNTER — Ambulatory Visit (INDEPENDENT_AMBULATORY_CARE_PROVIDER_SITE_OTHER): Payer: Medicare Other | Admitting: Family Medicine

## 2018-08-15 VITALS — BP 144/90 | HR 81 | Temp 98.0°F | Ht 63.0 in | Wt 143.2 lb

## 2018-08-15 DIAGNOSIS — E559 Vitamin D deficiency, unspecified: Secondary | ICD-10-CM

## 2018-08-15 DIAGNOSIS — D352 Benign neoplasm of pituitary gland: Secondary | ICD-10-CM | POA: Diagnosis not present

## 2018-08-15 DIAGNOSIS — E042 Nontoxic multinodular goiter: Secondary | ICD-10-CM | POA: Diagnosis not present

## 2018-08-15 DIAGNOSIS — M1612 Unilateral primary osteoarthritis, left hip: Secondary | ICD-10-CM

## 2018-08-15 DIAGNOSIS — E210 Primary hyperparathyroidism: Secondary | ICD-10-CM

## 2018-08-15 DIAGNOSIS — M810 Age-related osteoporosis without current pathological fracture: Secondary | ICD-10-CM | POA: Diagnosis not present

## 2018-08-15 DIAGNOSIS — I10 Essential (primary) hypertension: Secondary | ICD-10-CM | POA: Diagnosis not present

## 2018-08-15 DIAGNOSIS — E785 Hyperlipidemia, unspecified: Secondary | ICD-10-CM | POA: Diagnosis not present

## 2018-08-15 DIAGNOSIS — E041 Nontoxic single thyroid nodule: Secondary | ICD-10-CM | POA: Diagnosis not present

## 2018-08-15 DIAGNOSIS — F419 Anxiety disorder, unspecified: Secondary | ICD-10-CM | POA: Diagnosis not present

## 2018-08-15 MED ORDER — IRBESARTAN 150 MG PO TABS: 150.0000 mg | ORAL_TABLET | Freq: Every day | ORAL | 3 refills | Status: DC

## 2018-08-15 NOTE — Progress Notes (Unsigned)
Pt came to the lab and declined bmp lab order. Stated she would be having multiple labs drawn next week at her endocrinologists office.

## 2018-08-15 NOTE — Progress Notes (Signed)
HPI:  Using dictation device. Unfortunately this device frequently misinterprets words/phrases.  Bonnie Moon is a pleasant 67 year old here to re-establish care. We have not seen her in many years. Reviewed current medical issues per below:  Elevated blood pressure - the main reason for her visit: -reports for > 1 year -at home running 140-150s/70-80s on average -sometimes pressure in head, tingling in head when up -lots of anxiety for several years -wants to work on lifestyle -strong FH HTN in both parents -no cp, sob, doe, ha  Hyperparathyroidism/Thyroid goiter: -seeing endocrinologist, Dr. Shawna Orleans in [place] for management -has appointment in a few weeks -does scans and labs there on regular basis -she had records sent  Hyperlipidemia: -endocrinologist prescribes statin for this  Anxiety: -chronic -she thinks related to other health issues -refuses tx with meds or CBT -uses lorazepam very rarely for panic, rxd by her gynecologist -denies depression  ROS: See pertinent positives and negatives per HPI.  Past Medical History:  Diagnosis Date  . Allergy   . GERD (gastroesophageal reflux disease)   . Hyperlipidemia   . IBS (irritable bowel syndrome)   . Nephrolithiasis   . Osteopenia   . Thyroid nodule   . Tubular adenoma of colon 02/2009  . Vitamin D insufficiency     Past Surgical History:  Procedure Laterality Date  . THYROID SURGERY  01/17/2017   done by Dr. Oran Rein- at [facility] in [place]   . TONSILLECTOMY    . TOTAL HIP ARTHROPLASTY  05/2008    Family History  Problem Relation Age of Onset  . Atrial fibrillation Father   . Osteopenia Mother   . Heart disease Mother        a. fib  . High Cholesterol Mother   . Thyroid disease Daughter        thyroidectomy-nodules/cancer  . High Cholesterol Daughter   . Colon cancer Neg Hx   . Pancreatic cancer Neg Hx   . Rectal cancer Neg Hx   . Stomach cancer Neg Hx     SOCIAL HX: see hpi   Current Outpatient  Medications:  .  CALCIUM PO, Take 630 mg by mouth daily., Disp: , Rfl:  .  atorvastatin (LIPITOR) 20 MG tablet, Take 20 mg by mouth daily., Disp: , Rfl: 3 .  Cholecalciferol (VITAMIN D-3) 1000 UNITS CAPS, Take 5,000 Units by mouth. , Disp: , Rfl:  .  irbesartan (AVAPRO) 150 MG tablet, Take 1 tablet (150 mg total) by mouth daily., Disp: 30 tablet, Rfl: 3 .  Multiple Vitamins-Minerals (MULTIVITAMIN PO), Take by mouth. gummies 2 daily, Disp: , Rfl:   EXAM:  Vitals:   08/15/18 1314  BP: (!) 144/90  Pulse: 81  Temp: 98 F (36.7 C)    Body mass index is 25.37 kg/m.  GENERAL: vitals reviewed and listed above, alert, oriented, appears well hydrated and in no acute distress  HEENT: atraumatic, conjunttiva clear, no obvious abnormalities on inspection of external nose and ears  NECK: no obvious masses on inspection  LUNGS: clear to auscultation bilaterally, no wheezes, rales or rhonchi, good air movement  CV: HRRR, no peripheral edema  MS: moves all extremities without noticeable abnormality  PSYCH: pleasant and cooperative, no obvious depression or anxiety  ASSESSMENT AND PLAN:  Discussed the following assessment and plan:  Essential hypertension - Plan: Basic metabolic panel  Hyperparathyroidism, primary ([place])  Right thyroid nodule  Osteoarthritis of left hip, unspecified osteoarthritis type  Hyperlipidemia, unspecified hyperlipidemia type  Pituitary adenoma ([place]), Chronic  -  advised endo follow up regarding parathyroid and and thyroid as planned -discussed implications, risks, treatment options, side effect for hypertension and opted to start irbesartan, labs per order -refused fu shot -refused tx for anxiety -advised healthy diet such as med diet (sumarized in pt instructions and regular exercise) -advised follow up in 4 weeks -Patient advised to return or notify a doctor immediately if symptoms worsen or persist or new concerns arise.  Per review of endo notes from  this summer pt also has hx vit D def, osteoporosis (refused treatment with medications) and will recheck dexa in 2019.  Patient Instructions  BEFORE YOU LEAVE: -phq9 in epic -labs -follow up: 4 weeks  Follow up with your endocrinologist about the parathyroid issues and the thyroid. Ask about preferred blood pressure medications.  START the irbesartan and take once daily in the morning.  Eat a healthy Mediterranean diet - see below  We have ordered labs or studies at this visit. It can take up to 1-2 weeks for results and processing. IF results require follow up or explanation, we will call you with instructions. Clinically stable results will be released to your [facility]. If you have not heard from [place] or cannot find your results in [facility] in 2 weeks please contact our office at [phone].  If you are not yet signed up for [facility], please consider signing up.   We recommend the following healthy lifestyle for LIFE: 1) Small portions. But, make sure to get regular (at least 3 per day), healthy meals and small healthy snacks if needed.  2) Eat a healthy clean diet.   TRY TO EAT: -at least 5-7 servings of low sugar, colorful, and nutrient rich vegetables per day (not corn, potatoes or bananas.) -berries are the best choice if you wish to eat fruit (only eat small amounts if trying to reduce weight)  -lean meets (fish, white meat of chicken or [place]) -vegan proteins for some meals - beans or tofu, whole grains, nuts and seeds -Replace bad fats with good fats - good fats include: fish, nuts and seeds, canola oil, olive oil -small amounts of low fat or non fat dairy -small amounts of100 % whole grains - check the lables -drink plenty of water  AVOID: -SUGAR, sweets, anything with added sugar, corn syrup or sweeteners - must read labels as even foods advertised as "healthy" often are loaded with sugar -if you must have a sweetener, small amounts of stevia may be best -sweetened  beverages and artificially sweetened beverages -simple starches (rice, bread, potatoes, pasta, chips, etc - small amounts of 100% whole grains are ok) -red meat, pork, butter -fried foods, fast food, processed food, excessive dairy, eggs and coconut.  3)Get at least 150 minutes of sweaty aerobic exercise per week.  4)Reduce stress - consider counseling, meditation and relaxation to balance other aspects of your life.         [name], DO

## 2018-08-15 NOTE — Patient Instructions (Signed)
BEFORE YOU LEAVE: -phq9 in epic -labs -follow up: 4 weeks  Follow up with your endocrinologist about the parathyroid issues and the thyroid. Ask about preferred blood pressure medications.  START the irbesartan and take once daily in the morning.  Eat a healthy Mediterranean diet - see below  We have ordered labs or studies at this visit. It can take up to 1-2 weeks for results and processing. IF results require follow up or explanation, we will call you with instructions. Clinically stable results will be released to your [facility]. If you have not heard from [place] or cannot find your results in [facility] in 2 weeks please contact our office at [phone].  If you are not yet signed up for [facility], please consider signing up.   We recommend the following healthy lifestyle for LIFE: 1) Small portions. But, make sure to get regular (at least 3 per day), healthy meals and small healthy snacks if needed.  2) Eat a healthy clean diet.   TRY TO EAT: -at least 5-7 servings of low sugar, colorful, and nutrient rich vegetables per day (not corn, potatoes or bananas.) -berries are the best choice if you wish to eat fruit (only eat small amounts if trying to reduce weight)  -lean meets (fish, white meat of chicken or [place]) -vegan proteins for some meals - beans or tofu, whole grains, nuts and seeds -Replace bad fats with good fats - good fats include: fish, nuts and seeds, canola oil, olive oil -small amounts of low fat or non fat dairy -small amounts of100 % whole grains - check the lables -drink plenty of water  AVOID: -SUGAR, sweets, anything with added sugar, corn syrup or sweeteners - must read labels as even foods advertised as "healthy" often are loaded with sugar -if you must have a sweetener, small amounts of stevia may be best -sweetened beverages and artificially sweetened beverages -simple starches (rice, bread, potatoes, pasta, chips, etc - small amounts of 100% whole grains are  ok) -red meat, pork, butter -fried foods, fast food, processed food, excessive dairy, eggs and coconut.  3)Get at least 150 minutes of sweaty aerobic exercise per week.  4)Reduce stress - consider counseling, meditation and relaxation to balance other aspects of your life.

## 2018-08-16 DIAGNOSIS — Z8262 Family history of osteoporosis: Secondary | ICD-10-CM | POA: Diagnosis not present

## 2018-08-16 DIAGNOSIS — M810 Age-related osteoporosis without current pathological fracture: Secondary | ICD-10-CM | POA: Diagnosis not present

## 2018-08-16 DIAGNOSIS — Z96642 Presence of left artificial hip joint: Secondary | ICD-10-CM | POA: Diagnosis not present

## 2018-08-16 DIAGNOSIS — Z1231 Encounter for screening mammogram for malignant neoplasm of breast: Secondary | ICD-10-CM | POA: Diagnosis not present

## 2018-08-21 DIAGNOSIS — M810 Age-related osteoporosis without current pathological fracture: Secondary | ICD-10-CM | POA: Diagnosis not present

## 2018-08-21 DIAGNOSIS — I10 Essential (primary) hypertension: Secondary | ICD-10-CM | POA: Diagnosis not present

## 2018-08-21 DIAGNOSIS — E210 Primary hyperparathyroidism: Secondary | ICD-10-CM | POA: Diagnosis not present

## 2018-08-21 DIAGNOSIS — R739 Hyperglycemia, unspecified: Secondary | ICD-10-CM | POA: Diagnosis not present

## 2018-08-21 DIAGNOSIS — E785 Hyperlipidemia, unspecified: Secondary | ICD-10-CM | POA: Diagnosis not present

## 2018-08-21 DIAGNOSIS — E042 Nontoxic multinodular goiter: Secondary | ICD-10-CM | POA: Diagnosis not present

## 2018-08-21 DIAGNOSIS — E559 Vitamin D deficiency, unspecified: Secondary | ICD-10-CM | POA: Diagnosis not present

## 2018-08-29 DIAGNOSIS — M810 Age-related osteoporosis without current pathological fracture: Secondary | ICD-10-CM | POA: Diagnosis not present

## 2018-08-29 DIAGNOSIS — E559 Vitamin D deficiency, unspecified: Secondary | ICD-10-CM | POA: Diagnosis not present

## 2018-08-29 DIAGNOSIS — E042 Nontoxic multinodular goiter: Secondary | ICD-10-CM | POA: Diagnosis not present

## 2018-08-29 DIAGNOSIS — E210 Primary hyperparathyroidism: Secondary | ICD-10-CM | POA: Diagnosis not present

## 2018-08-29 DIAGNOSIS — I10 Essential (primary) hypertension: Secondary | ICD-10-CM | POA: Diagnosis not present

## 2018-08-29 DIAGNOSIS — R739 Hyperglycemia, unspecified: Secondary | ICD-10-CM | POA: Diagnosis not present

## 2018-08-29 DIAGNOSIS — E785 Hyperlipidemia, unspecified: Secondary | ICD-10-CM | POA: Diagnosis not present

## 2018-08-30 ENCOUNTER — Ambulatory Visit: Payer: Medicare Other | Admitting: Nurse Practitioner

## 2018-09-06 ENCOUNTER — Encounter: Payer: Self-pay | Admitting: Obstetrics & Gynecology

## 2018-09-11 NOTE — Progress Notes (Signed)
HPI:  Using dictation device. Unfortunately this device frequently misinterprets words/phrases.  Check bmp  Follow up Hypertension: -started irbesartan 11/19 -reports:doing well, endo did all her labs including kidney check last week -denies:intol, cp, sob  GAD: -caring for elderly parents -doe snot want to take meds -not daily, no severe symptoms  THyroid goiter/Hyperparathyroid: -seeing endo for management, seen recently and labs done  ROS: See pertinent positives and negatives per HPI.  Past Medical History:  Diagnosis Date  . Allergy   . GERD (gastroesophageal reflux disease)   . Hyperlipidemia   . IBS (irritable bowel syndrome)   . Nephrolithiasis   . Osteopenia   . Thyroid nodule   . Tubular adenoma of colon 02/2009  . Vitamin D insufficiency     Past Surgical History:  Procedure Laterality Date  . THYROID SURGERY  01/17/2017   done by Dr. Oran Rein- at [facility] in [place]   . TONSILLECTOMY    . TOTAL HIP ARTHROPLASTY  05/2008    Family History  Problem Relation Age of Onset  . Atrial fibrillation Father   . Osteopenia Mother   . Heart disease Mother        a. fib  . High Cholesterol Mother   . Thyroid disease Daughter        thyroidectomy-nodules/cancer  . High Cholesterol Daughter   . Colon cancer Neg Hx   . Pancreatic cancer Neg Hx   . Rectal cancer Neg Hx   . Stomach cancer Neg Hx     SOCIAL HX: see hpi   Current Outpatient Medications:  .  atorvastatin (LIPITOR) 20 MG tablet, Take 20 mg by mouth daily., Disp: , Rfl: 3 .  CALCIUM PO, Take 630 mg by mouth daily., Disp: , Rfl:  .  Cholecalciferol (VITAMIN D-3) 1000 UNITS CAPS, Take 5,000 Units by mouth. , Disp: , Rfl:  .  irbesartan (AVAPRO) 150 MG tablet, Take 1 tablet (150 mg total) by mouth daily., Disp: 30 tablet, Rfl: 3 .  Multiple Vitamins-Minerals (MULTIVITAMIN PO), Take by mouth. gummies 2 daily, Disp: , Rfl:   EXAM:  Vitals:   09/12/18 1120 09/12/18 1125  BP: 118/70 118/70  Pulse: 61    Temp: 98.5 F (36.9 C)     Body mass index is 25.19 kg/m.  GENERAL: vitals reviewed and listed above, alert, oriented, appears well hydrated and in no acute distress  HEENT: atraumatic, conjunttiva clear, no obvious abnormalities on inspection of external nose and ears  NECK: no obvious masses on inspection  LUNGS: clear to auscultation bilaterally, no wheezes, rales or rhonchi, good air movement  CV: HRRR, no peripheral edema  MS: moves all extremities without noticeable abnormality  PSYCH: pleasant and cooperative, no obvious depression or anxiety  ASSESSMENT AND PLAN:  Discussed the following assessment and plan:  Essential hypertension - Plan: Basic metabolic panel -cont losartan -bp a little elevated on arrival, great on recheck -reports did labs, kidney check with endo - asked assistant to obtain copy  Hyperparathyroidism, primary ([place]) Pituitary adenoma ([place]) Goiter, nontoxic, multinodular -seeing endo for management -requested assistant to pull in records  Anxiety -advised of treatment options -she prefers to avoid meds and will consider counseling - she plans to call behavioral health office and insurance for help finding provider  -Patient advised to return or notify a doctor immediately if symptoms worsen or persist or new concerns arise.  Patient Instructions  BEFORE YOU LEAVE: -please request labs form endo -follow up: 3-4 months  Continue losartan daily and  other medications.  Please try to schedule CBT (counseling) for the stress.  Eat a healthy low sugar diet and get regular exercise.  Have a very merry Christmas!   [name], DO

## 2018-09-12 ENCOUNTER — Encounter: Payer: Self-pay | Admitting: Family Medicine

## 2018-09-12 ENCOUNTER — Ambulatory Visit (INDEPENDENT_AMBULATORY_CARE_PROVIDER_SITE_OTHER): Payer: Medicare Other | Admitting: Family Medicine

## 2018-09-12 VITALS — BP 118/70 | HR 61 | Temp 98.5°F | Ht 63.0 in | Wt 142.2 lb

## 2018-09-12 DIAGNOSIS — E042 Nontoxic multinodular goiter: Secondary | ICD-10-CM | POA: Diagnosis not present

## 2018-09-12 DIAGNOSIS — E210 Primary hyperparathyroidism: Secondary | ICD-10-CM | POA: Diagnosis not present

## 2018-09-12 DIAGNOSIS — I10 Essential (primary) hypertension: Secondary | ICD-10-CM | POA: Diagnosis not present

## 2018-09-12 DIAGNOSIS — D352 Benign neoplasm of pituitary gland: Secondary | ICD-10-CM | POA: Diagnosis not present

## 2018-09-12 DIAGNOSIS — F419 Anxiety disorder, unspecified: Secondary | ICD-10-CM | POA: Diagnosis not present

## 2018-09-12 NOTE — Patient Instructions (Signed)
BEFORE YOU LEAVE: -please request labs form endo -follow up: 3-4 months  Continue losartan daily and other medications.  Please try to schedule CBT (counseling) for the stress.  Eat a healthy low sugar diet and get regular exercise.  Have a very merry Christmas!

## 2018-12-06 ENCOUNTER — Other Ambulatory Visit: Payer: Self-pay | Admitting: Family Medicine

## 2018-12-08 ENCOUNTER — Ambulatory Visit: Payer: Self-pay | Admitting: *Deleted

## 2018-12-08 NOTE — Telephone Encounter (Signed)
Contacted pt regarding her symptoms; she states that on 12/07/2018 she started having a chilled feeling, soreness under her ears, and intermittent nasal stuffiness with drainage; she did take an ibuprofen yesterday; the pt says that she feels like her ears are stopped up; nurse triage initiated and recommendations made per protocol; she would like to wait and be seen by Dr Maudie Mercury on 12/11/2018; pt offered and accepted appointment with Dr Maudie Mercury, [facility], 12/11/2018 at 0900; she verbalized understanding; will route to office for notification.       Reason for Disposition . Earache  Answer Assessment - Initial Assessment Questions 1. LOCATION: "Where does it hurt?"      Under ears and head 2. ONSET: "When did the sinus pain start?"  (e.g., hours, days)      12/07/2018 3. SEVERITY: "How bad is the pain?"   (Scale 1-10; mild, moderate or severe)   - MILD (1-3): doesn't interfere with normal activities    - MODERATE (4-7): interferes with normal activities (e.g., work or school) or awakens from sleep   - SEVERE (8-10): excruciating pain and patient unable to do any normal activities        Pressure is mild  4. RECURRENT SYMPTOM: "Have you ever had sinus problems before?" If so, ask: "When was the last time?" and "What happened that time?"      Yes; history of sinus problems  5. NASAL CONGESTION: "Is the nose blocked?" If so, ask, "Can you open it or must you breathe through the mouth?"     Nasal congestion 6. NASAL DISCHARGE: "Do you have discharge from your nose?" If so ask, "What color?"     Yes, clear 7. FEVER: "Do you have a fever?" If so, ask: "What is it, how was it measured, and when did it start?"      no 8. OTHER SYMPTOMS: "Do you have any other symptoms?" (e.g., sore throat, cough, earache, difficulty breathing)     Pressure in both ears, chills on 12/07/2018 9. PREGNANCY: "Is there any chance you are pregnant?" "When was your last menstrual period?"     no  Protocols used: SINUS PAIN OR  CONGESTION-A-AH

## 2018-12-08 NOTE — Telephone Encounter (Signed)
Noted  

## 2018-12-11 ENCOUNTER — Ambulatory Visit: Payer: Medicare Other | Admitting: Family Medicine

## 2018-12-12 DIAGNOSIS — J069 Acute upper respiratory infection, unspecified: Secondary | ICD-10-CM | POA: Diagnosis not present

## 2018-12-13 ENCOUNTER — Ambulatory Visit: Payer: Self-pay | Admitting: *Deleted

## 2018-12-13 NOTE — Telephone Encounter (Signed)
Please check in with her:  *any fever? *what is blood pressure now? *any color change with extremities? *how is she feeling overall? Any other sick symptoms, changes from normal?

## 2018-12-13 NOTE — Telephone Encounter (Signed)
I left a message for the pt to return a call to the office.  CRM also created.

## 2018-12-13 NOTE — Telephone Encounter (Addendum)
Pt stated her feet and hands are very cold and she feels like she doesn't have any circulation in them. She took a shower and they were still cold when she got out; the pt she had symptoms of allergies and sinuses on 12/05/2018; she has her BP was 110//? on 12/12/18; the pt states that her hands and feet were cold, and she has been wearing 2 pair of socks; she feels like she is not getting any circulation; the pt would like to know if she should see someone; pt is offered appointment to be seen in office; she is afraid of coming in the office because of all of the bugs; she would  like for someone from the office to call her; the pt can be contacted [phone] and a message can be left on voicemail; notified Sheena.

## 2018-12-13 NOTE — Telephone Encounter (Signed)
  Reason for Disposition . Nursing judgment or information in reference  Answer Assessment - Initial Assessment Questions 1. REASON FOR CALL: "What is your main concern right now?"     Cold extremities 2. ONSET: "When did the  start?"     12/12/2018 3. SEVERITY: "How bad is the ?"     moderate 4. FEVER: "Do you have a fever?"     no 5. OTHER SYMPTOMS: "Do you have any other new symptoms?"     no 6. INTERVENTIONS AND RESPONSE: "What have you done so far to try to make this better? What medications have you used?"     Put on extra socks 7. PREGNANCY: "Is there any chance you are pregnant?"     no  Protocols used: NO GUIDELINE AVAILABLE-A-AH

## 2018-12-13 NOTE — Telephone Encounter (Signed)
Dr. Maudie Mercury is out of the office. Pt does not want to come in for visit due to current virus concerns.   Dr. Ethlyn Gallery - Please advise. Thanks!

## 2018-12-14 ENCOUNTER — Telehealth: Payer: Self-pay | Admitting: Obstetrics & Gynecology

## 2018-12-14 NOTE — Telephone Encounter (Signed)
I called the pt and discussed the questions below.  Patient denies a fever, last BP reading from yesterday was 145-79 and she has not checked for this today, denies any color changes in the extremities, and stated she has been up and about and active today, does not feel "cold" and is feeling OK today.  States she feels she often gets herself upset and thinks this is all due to allergies.  Denied an appt today and stated she will call back on Monday to let us know how she is feeling.  Message sent to Dr Ethlyn Gallery as Juluis Rainier.

## 2018-12-14 NOTE — Telephone Encounter (Signed)
Spoke with patient. Requesting refill of xanax 0.25mg  tab po prn for anxiety. Patient states she is taking care of her 66 yo and 57 yo parents and is experienced increased stresses and anxiety with COVID-19 restrictions and concerns. Last RX filled 12/12/2015, never had the refill filled. Wants Dr. Sabra Heck to know she is on a new b/p medication, Avapro 150 mg, wants to be sure no interactions with xanax. Advised Dr. Sabra Heck will review, our office will return call to advise. Patient denies depression, SI/HI.  Patient verbalizes understanding and is agreeable.   Dr. Sabra Heck -please advise on RX.

## 2018-12-14 NOTE — Telephone Encounter (Signed)
I called the pt and informed her of the message below and she agreed.

## 2018-12-14 NOTE — Telephone Encounter (Signed)
I agree with keeping her home unless symptoms are worsening. Symptoms may have been related to to some anxiety; sometimes when anxious, breathing faster we can get some changes in sensation in extremities. As long as she is comfortable letting us know about changes in status that is good. Thanks for the detailed message!

## 2018-12-14 NOTE — Telephone Encounter (Signed)
Patient would like to speak with nurse about a prescription for alprazolam. [facility] in [place]. [phone]

## 2018-12-15 MED ORDER — ALPRAZOLAM 0.25 MG PO TABS: ORAL_TABLET | ORAL | 0 refills | Status: DC

## 2018-12-15 NOTE — Telephone Encounter (Signed)
Rx completed and sent to pharmacy on file.  Ok to close encounter.

## 2019-01-15 ENCOUNTER — Ambulatory Visit (INDEPENDENT_AMBULATORY_CARE_PROVIDER_SITE_OTHER): Payer: Medicare Other | Admitting: Family Medicine

## 2019-01-15 ENCOUNTER — Encounter: Payer: Self-pay | Admitting: Family Medicine

## 2019-01-15 ENCOUNTER — Other Ambulatory Visit: Payer: Self-pay

## 2019-01-15 DIAGNOSIS — F419 Anxiety disorder, unspecified: Secondary | ICD-10-CM | POA: Diagnosis not present

## 2019-01-15 DIAGNOSIS — E210 Primary hyperparathyroidism: Secondary | ICD-10-CM

## 2019-01-15 DIAGNOSIS — I10 Essential (primary) hypertension: Secondary | ICD-10-CM | POA: Diagnosis not present

## 2019-01-15 NOTE — Progress Notes (Signed)
Virtual Visit via Video Note  I connected with anet  on 01/15/19 at 11:15 AM EDT by a video enabled telemedicine application and verified that I am speaking with the correct person using two identifiers.  Location patient: home Location provider:work or home office Persons participating in the virtual visit: patient, provider  I discussed the limitations of evaluation and management by telemedicine and the availability of in person appointments. The patient expressed understanding and agreed to proceed.   HPI:  Bonnie Moon is a pleasant 68 y.o. here for follow up. Chronic medical problems summarized below were reviewed for changes and stability and were updated as needed below. These issues and their treatment remain stable for the most part.  Doing well. Riding bike 2-4 miles per day. Working on eating a healthier diet. Has a lot of anxiety with the [place] pandemic. Reports just had labs again with endo. Denies CP, SOB, DOE, treatment intolerance or new symptoms.  Follow up Hypertension: -started irbesartan 11/19 -reports:doing well, endo did all her labs including kidney check last week -denies:intol, cp, sob -FH hypertension, tends to be anxious  GAD: -increased with the [place] pandemic -does not want to take meds -not daily, no severe symptoms  Thyroid goiter/Hyperparathyroid: -seeing endo for management, seen recently and labs done -Sees Dr. Shawna Orleans in [place]  ROS: See pertinent positives and negatives per HPI.  Past Medical History:  Diagnosis Date  . Allergy   . GERD (gastroesophageal reflux disease)   . Hyperlipidemia   . IBS (irritable bowel syndrome)   . Nephrolithiasis   . Osteopenia   . Thyroid nodule   . Tubular adenoma of colon 02/2009  . Vitamin D insufficiency     Past Surgical History:  Procedure Laterality Date  . THYROID SURGERY  01/17/2017   done by Dr. Oran Rein- at [facility] in [place]   . TONSILLECTOMY    . TOTAL HIP ARTHROPLASTY  05/2008    Family  History  Problem Relation Age of Onset  . Atrial fibrillation Father   . Osteopenia Mother   . Heart disease Mother        a. fib  . High Cholesterol Mother   . Thyroid disease Daughter        thyroidectomy-nodules/cancer  . High Cholesterol Daughter   . Colon cancer Neg Hx   . Pancreatic cancer Neg Hx   . Rectal cancer Neg Hx   . Stomach cancer Neg Hx     SOCIAL HX: see hpi   Current Outpatient Medications:  .  ALPRAZolam (XANAX) 0.25 MG tablet, 1 tab orally as needed for anxiety not to exceed more than two a day, Disp: 30 tablet, Rfl: 0 .  atorvastatin (LIPITOR) 20 MG tablet, Take 20 mg by mouth daily., Disp: , Rfl: 3 .  CALCIUM PO, Take 630 mg by mouth daily., Disp: , Rfl:  .  Cholecalciferol (VITAMIN D-3) 1000 UNITS CAPS, Take 5,000 Units by mouth. , Disp: , Rfl:  .  irbesartan (AVAPRO) 150 MG tablet, TAKE ONE TABLET BY MOUTH DAILY, Disp: 30 tablet, Rfl: 3 .  Multiple Vitamins-Minerals (MULTIVITAMIN PO), Take by mouth. gummies 2 daily, Disp: , Rfl:   EXAM:  VITALS per patient if applicable: T 68.1, BP 275/17  GENERAL: alert, oriented, appears well and in no acute distress  HEENT: atraumatic, conjunttiva clear, no obvious abnormalities on inspection of external nose and ears  NECK: normal movements of the head and neck  LUNGS: on inspection no signs of respiratory distress, breathing rate  appears normal, no obvious gross SOB, gasping or wheezing  CV: no obvious cyanosis  MS: moves all visible extremities without noticeable abnormality  PSYCH/NEURO: pleasant and cooperative, no obvious depression or anxiety, speech and thought processing grossly intact  ASSESSMENT AND PLAN:  Discussed the following assessment and plan:  Anxiety  - she has used xanax once from her gyn, discussed risks - discussed other treatment options and advised CBT - she would like to try counseling before trying any medication  Essential hypertension -start taking medication in the  morning -monitor BP at home, call if running high  Hyperparathyroidism, primary ([place]) -continue with endocrinology  I discussed the assessment and treatment plan with the patient. The patient was provided an opportunity to ask questions and all were answered. The patient agreed with the plan and demonstrated an understanding of the instructions.   The patient was advised to call back or seek an in-person evaluation if needed.   Follow up instructions: Advised assistant Wendie Simmer to help patient arrange the following: -please provide her the number to call [facility] to arrange a visit with Dennison Bulla -please obtain labs/OV notes from recent visit from her endocrinology office, Dr. Shawna Orleans at [place] up with Dr Ethlyn Gallery as planned    [name], DO

## 2019-01-24 ENCOUNTER — Ambulatory Visit: Payer: Medicare Other | Admitting: Psychology

## 2019-02-23 ENCOUNTER — Ambulatory Visit (INDEPENDENT_AMBULATORY_CARE_PROVIDER_SITE_OTHER): Payer: Medicare Other | Admitting: Family Medicine

## 2019-02-23 ENCOUNTER — Other Ambulatory Visit: Payer: Self-pay

## 2019-02-23 DIAGNOSIS — M810 Age-related osteoporosis without current pathological fracture: Secondary | ICD-10-CM | POA: Diagnosis not present

## 2019-02-23 DIAGNOSIS — M542 Cervicalgia: Secondary | ICD-10-CM

## 2019-02-23 DIAGNOSIS — E210 Primary hyperparathyroidism: Secondary | ICD-10-CM | POA: Diagnosis not present

## 2019-02-23 DIAGNOSIS — E559 Vitamin D deficiency, unspecified: Secondary | ICD-10-CM | POA: Diagnosis not present

## 2019-02-23 DIAGNOSIS — I10 Essential (primary) hypertension: Secondary | ICD-10-CM | POA: Diagnosis not present

## 2019-02-23 DIAGNOSIS — Z131 Encounter for screening for diabetes mellitus: Secondary | ICD-10-CM

## 2019-02-23 DIAGNOSIS — E785 Hyperlipidemia, unspecified: Secondary | ICD-10-CM

## 2019-02-23 MED ORDER — MELOXICAM 7.5 MG PO TABS: 7.5000 mg | ORAL_TABLET | Freq: Every day | ORAL | 0 refills | Status: DC

## 2019-02-23 MED ORDER — DIAZEPAM 5 MG PO TABS: 2.5000 mg | ORAL_TABLET | Freq: Every evening | ORAL | 0 refills | Status: DC | PRN

## 2019-02-23 NOTE — Progress Notes (Signed)
Virtual Visit via Video Note  I connected with Bonnie Moon  on 02/23/19 at  2:00 PM EDT by a video enabled telemedicine application and verified that I am speaking with the correct person using two identifiers.  Location patient: home Location provider:work or home office Persons participating in the virtual visit: patient, provider  I discussed the limitations of evaluation and management by telemedicine and the availability of in person appointments. The patient expressed understanding and agreed to proceed.   Bonnie Moon DOB: 12/07/1950 Encounter date: 02/23/2019  This is a 68 y.o. female who presents with Chief Complaint  Patient presents with  . Back Pain    History of present illness:  Right across upper back/lower neck.   Left arm has been bothering her. Was washing hair and then right across upper back lower neck felt pull. Has had shooting, burning pain down arm to elbow.   Feels this some behind left shoulder blade.   Shooting pains awhile ago. Noted discomfort with driving. When doing certain motion she will get pain shooting down.   In 2017 had xrays done. Had felt like arm was frozen at that time. Was trying on clothes and had difficulty using arm. When she started working out in gym it got a little better. Shoulder not frozen. Feels somewhat similar to current discomfort. Was told at that time that there was some significant arthritis neck.  Not sure exactly what she did - but states she carries heavy groceries.   Does feel like she doesn't grip as well as she used to. When she moves around it feels better than when lying down. Hasn't tried anything on this area. Tried ice. Not sure it helped much.    Wants to complete bloodwork; knows she is due  Shooting pain with lying down makes her anxious as well which makes sleep more difficult. She has taken xanax in past, but not using currently. Has not used muscle relaxers in past that she recalls.  Stopped lipitor  because of cramping.  Allergies  Allergen Reactions  . Lipitor [Atorvastatin Calcium]    No outpatient medications have been marked as taking for the 02/23/19 encounter (Office Visit) with Caren Macadam, MD.    Review of Systems  Constitutional: Negative for chills, fatigue and fever.  Respiratory: Negative for cough, chest tightness, shortness of breath and wheezing.   Cardiovascular: Negative for chest pain, palpitations and leg swelling.  Musculoskeletal: Positive for neck pain and neck stiffness. Negative for arthralgias.  Neurological: Positive for numbness (shooting pain; see hpi). Negative for weakness.    Objective:  LMP 07/28/2002       BP Readings from Last 3 Encounters:  09/12/18 118/70  08/15/18 (!) 144/90  03/17/18 126/80   Wt Readings from Last 3 Encounters:  09/12/18 142 lb 3.2 oz (64.5 kg)  08/15/18 143 lb 3.2 oz (65 kg)  03/17/18 146 lb 9.6 oz (66.5 kg)    EXAM:  GENERAL: alert, oriented, appears well and in no acute distress  HEENT: atraumatic, conjunctiva clear, no obvious abnormalities on inspection of external nose and ears  NECK: normal movements of the head and neck  LUNGS: on inspection no signs of respiratory distress, breathing rate appears normal, no obvious gross SOB, gasping or wheezing  CV: no obvious cyanosis  MS: moves all visible extremities without noticeable abnormality; no significantly limited ROM of neck noted although with flexion there is tension across lower neck/upper back. ROM of arm/shoulder is not limited.  PSYCH/NEURO: pleasant and cooperative, no obvious depression or anxiety, speech and thought processing grossly intact  Assessment/Plan  1. Neck pain Gentle stretches, ice, heat. Meloxicam at bedtime. Wanted to hold off on prednisone if possible. Trial of valium to help with muscle relaxation/anxiety. Lowest effective dose and just short term. Topical voltaren and thermacare recommended as well. We will call and  check in on status Monday.  2. Essential hypertension Has been stable. Continue current meds. - CBC with Differential/Platelet; Future - Comprehensive metabolic panel; Future  3. Hyperparathyroidism, primary [facility]) Follows with endocrinology. Has been stable. - PTH, Intact and Calcium; Future  4. Osteoporosis, unspecified osteoporosis type, unspecified pathological fracture presence Working on weight bearing exercise; taking vitamin D. Has declined prolia in past. Will discuss more at [facility] visit.  5. Vitamin D deficiency  - VITAMIN D 25 Hydroxy (Vit-D Deficiency, Fractures); Future  6. Hyperlipidemia, unspecified hyperlipidemia type Would consider trial pravastatin. Had cramping with lipitor. - Lipid panel; Future - TSH; Future - T4, free; Future - T3, free; Future  7. Screening for diabetes mellitus A1 c has been stable. Not covered at this point for screening; will add on if glucose elevated.  I discussed the assessment and treatment plan with the patient. The patient was provided an opportunity to ask questions and all were answered. The patient agreed with the plan and demonstrated an understanding of the instructions.   The patient was advised to call back or seek an in-person evaluation if the symptoms worsen or if the condition fails to improve as anticipated.  I provided 25 minutes of non-face-to-face time during this encounter.   Micheline Rough, MD

## 2019-02-26 ENCOUNTER — Telehealth: Payer: Self-pay | Admitting: *Deleted

## 2019-02-26 ENCOUNTER — Other Ambulatory Visit: Payer: Self-pay | Admitting: Family Medicine

## 2019-02-26 DIAGNOSIS — M542 Cervicalgia: Secondary | ICD-10-CM

## 2019-02-26 NOTE — Telephone Encounter (Signed)
I spoke with Bonnie Moon -   I have added cervical xry to be completed weds when she does bloodwork; please let xray know.   Please also check in with her on Tuesday to see how she slept. We discussed taking xanax at bedtime (2 tabs). This has helped her with anxiety/sleep more in the past and we were going to try this before doing other medication. She will not take the valium with this.   Let me know if still not sleeping well. We discussed neurontin. If pain isn't improving we will make her upcoming visit an in office one.

## 2019-02-26 NOTE — Telephone Encounter (Signed)
Patient called back and stated she still has pain which is worse when she is lying down or sitting due to pressure, would rate pain a 5 during the day-9 at night only.  Stated pain goes across shoulders down to the left arm, shooting, nerve pain and "feels like entire body is on fire at night".  Taking Valium and only slept for 1 hour.  Message sent to Dr Ethlyn Gallery.

## 2019-02-26 NOTE — Telephone Encounter (Signed)
I left a message for the pt to return my call. 

## 2019-02-26 NOTE — Telephone Encounter (Signed)
-----   Message from Caren Macadam, MD sent at 02/24/2019 12:29 AM EDT ----- Please check in with her on Monday and see how pain is doing? Please schedule lab appointment for her.

## 2019-02-27 NOTE — Telephone Encounter (Signed)
Noted. Thanks! Will be back in touch once we get lab results/xray results

## 2019-02-27 NOTE — Telephone Encounter (Signed)
I called the pt and she stated she slept really good last night, states she only took 1 pill and wanted to let Dr Ethlyn Gallery know she appreciates her care and concern.  Message sent to Dr Ethlyn Gallery.

## 2019-02-28 ENCOUNTER — Ambulatory Visit (INDEPENDENT_AMBULATORY_CARE_PROVIDER_SITE_OTHER): Payer: Medicare Other

## 2019-02-28 ENCOUNTER — Other Ambulatory Visit (INDEPENDENT_AMBULATORY_CARE_PROVIDER_SITE_OTHER): Payer: Medicare Other

## 2019-02-28 DIAGNOSIS — E559 Vitamin D deficiency, unspecified: Secondary | ICD-10-CM

## 2019-02-28 DIAGNOSIS — I10 Essential (primary) hypertension: Secondary | ICD-10-CM | POA: Diagnosis not present

## 2019-02-28 DIAGNOSIS — E210 Primary hyperparathyroidism: Secondary | ICD-10-CM | POA: Diagnosis not present

## 2019-02-28 DIAGNOSIS — E785 Hyperlipidemia, unspecified: Secondary | ICD-10-CM

## 2019-02-28 DIAGNOSIS — M542 Cervicalgia: Secondary | ICD-10-CM | POA: Diagnosis not present

## 2019-02-28 LAB — TSH: TSH: 0.42 u[IU]/mL (ref 0.35–4.50)

## 2019-02-28 LAB — CBC WITH DIFFERENTIAL/PLATELET
Basophils Absolute: 0 10*3/uL (ref 0.0–0.1)
Basophils Relative: 0.8 % (ref 0.0–3.0)
Eosinophils Absolute: 0.2 10*3/uL (ref 0.0–0.7)
Eosinophils Relative: 3.9 % (ref 0.0–5.0)
HCT: 41.5 % (ref 36.0–46.0)
Hemoglobin: 14.2 g/dL (ref 12.0–15.0)
Lymphocytes Relative: 40.3 % (ref 12.0–46.0)
Lymphs Abs: 2.1 10*3/uL (ref 0.7–4.0)
MCHC: 34.1 g/dL (ref 30.0–36.0)
MCV: 92.2 fl (ref 78.0–100.0)
Monocytes Absolute: 0.4 10*3/uL (ref 0.1–1.0)
Monocytes Relative: 7.8 % (ref 3.0–12.0)
Neutro Abs: 2.5 10*3/uL (ref 1.4–7.7)
Neutrophils Relative %: 47.2 % (ref 43.0–77.0)
Platelets: 273 10*3/uL (ref 150.0–400.0)
RBC: 4.5 Mil/uL (ref 3.87–5.11)
RDW: 14.1 % (ref 11.5–15.5)
WBC: 5.3 10*3/uL (ref 4.0–10.5)

## 2019-02-28 LAB — COMPREHENSIVE METABOLIC PANEL
ALT: 10 U/L (ref 0–35)
AST: 17 U/L (ref 0–37)
Albumin: 4.4 g/dL (ref 3.5–5.2)
Alkaline Phosphatase: 95 U/L (ref 39–117)
BUN: 15 mg/dL (ref 6–23)
CO2: 29 mEq/L (ref 19–32)
Calcium: 9.7 mg/dL (ref 8.4–10.5)
Chloride: 102 mEq/L (ref 96–112)
Creatinine, Ser: 0.78 mg/dL (ref 0.40–1.20)
GFR: 73.8 mL/min (ref >60.00)
Glucose, Bld: 83 mg/dL (ref 70–99)
Potassium: 4.2 mEq/L (ref 3.5–5.1)
Sodium: 140 mEq/L (ref 135–145)
Total Bilirubin: 0.9 mg/dL (ref 0.2–1.2)
Total Protein: 7.1 g/dL (ref 6.0–8.3)

## 2019-02-28 LAB — VITAMIN D 25 HYDROXY (VIT D DEFICIENCY, FRACTURES): VITD: 65.63 ng/mL (ref 30.00–100.00)

## 2019-02-28 LAB — LIPID PANEL
Cholesterol: 269 mg/dL — ABNORMAL HIGH (ref 0–200)
HDL: 79.7 mg/dL (ref >39.00)
LDL Cholesterol: 173 mg/dL — ABNORMAL HIGH (ref 0–99)
NonHDL: 189.27
Total CHOL/HDL Ratio: 3
Triglycerides: 81 mg/dL (ref 0.0–149.0)
VLDL: 16.2 mg/dL (ref 0.0–40.0)

## 2019-02-28 LAB — T4, FREE: Free T4: 1.08 ng/dL (ref 0.60–1.60)

## 2019-02-28 LAB — T3, FREE: T3, Free: 2.8 pg/mL (ref 2.3–4.2)

## 2019-03-02 LAB — PTH, INTACT AND CALCIUM
Calcium: 9.8 mg/dL (ref 8.6–10.4)
PTH: 5 pg/mL — ABNORMAL LOW (ref 14–64)

## 2019-03-05 ENCOUNTER — Encounter: Payer: Self-pay | Admitting: Family Medicine

## 2019-03-05 ENCOUNTER — Ambulatory Visit (INDEPENDENT_AMBULATORY_CARE_PROVIDER_SITE_OTHER): Payer: Medicare Other | Admitting: Family Medicine

## 2019-03-05 ENCOUNTER — Other Ambulatory Visit: Payer: Self-pay

## 2019-03-05 VITALS — BP 128/76 | HR 79 | Temp 98.1°F

## 2019-03-05 DIAGNOSIS — I10 Essential (primary) hypertension: Secondary | ICD-10-CM

## 2019-03-05 DIAGNOSIS — E785 Hyperlipidemia, unspecified: Secondary | ICD-10-CM | POA: Diagnosis not present

## 2019-03-05 DIAGNOSIS — E210 Primary hyperparathyroidism: Secondary | ICD-10-CM | POA: Diagnosis not present

## 2019-03-05 DIAGNOSIS — M47812 Spondylosis without myelopathy or radiculopathy, cervical region: Secondary | ICD-10-CM | POA: Diagnosis not present

## 2019-03-05 MED ORDER — PRAVASTATIN SODIUM 20 MG PO TABS: 20.0000 mg | ORAL_TABLET | Freq: Every day | ORAL | 1 refills | Status: DC

## 2019-03-05 MED ORDER — IRBESARTAN 150 MG PO TABS: 150.0000 mg | ORAL_TABLET | Freq: Every day | ORAL | 1 refills | Status: DC

## 2019-03-05 NOTE — Patient Instructions (Addendum)
Cervical Strain and Sprain Rehab Ask your health care provider which exercises are safe for you. Do exercises exactly as told by your health care provider and adjust them as directed. It is normal to feel mild stretching, pulling, tightness, or discomfort as you do these exercises, but you should stop right away if you feel sudden pain or your pain gets worse.Do not begin these exercises until told by your health care provider. Stretching and range of motion exercises These exercises warm up your muscles and joints and improve the movement and flexibility of your neck. These exercises also help to relieve pain, numbness, and tingling. Exercise A: Cervical side bend  1. Using good posture, sit on a stable chair or stand up. 2. Without moving your shoulders, slowly tilt your left / right ear to your shoulder until you feel a stretch in your neck muscles. You should be looking straight ahead. 3. Hold for ___10_______ seconds. 4. Repeat with the other side of your neck. Repeat ______3____ times. Complete this exercise _______2___ times a day. Exercise B: Cervical rotation  1. Using good posture, sit on a stable chair or stand up. 2. Slowly turn your head to the side as if you are looking over your left / right shoulder. ? Keep your eyes level with the ground. ? Stop when you feel a stretch along the side and the back of your neck. 3. Hold for _____10_____ seconds. 4. Repeat this by turning to your other side. Repeat _____3___ times. Complete this exercise ______2____ times a day. Exercise C: Thoracic extension and pectoral stretch 1. Roll a towel or a small blanket so it is about 4 inches (10 cm) in diameter. 2. Lie down on your back on a firm surface. 3. Put the towel lengthwise, under your spine in the middle of your back. It should not be not under your shoulder blades. The towel should line up with your spine from your middle back to your lower back. 4. Put your hands behind your head and  let your elbows fall out to your sides. 5. Hold for ________15__ seconds. Repeat ___2_______ times. Complete this exercise ________2__ times a day. Strengthening exercises These exercises build strength and endurance in your neck. Endurance is the ability to use your muscles for a long time, even after your muscles get tired. Exercise D: Upper cervical flexion, isometric 1. Lie on your back with a thin pillow behind your head and a small rolled-up towel under your neck. 2. Gently tuck your chin toward your chest and nod your head down to look toward your feet. Do not lift your head off the pillow. 3. Hold for ____10______ seconds. 4. Release the tension slowly. Relax your neck muscles completely before you repeat this exercise. Repeat _____3_____ times. Complete this exercise _____2_____ times a day. Exercise E: Cervical extension, isometric  1. Stand about 6 inches (15 cm) away from a wall, with your back facing the wall. 2. Place a soft object, about 6-8 inches (15-20 cm) in diameter, between the back of your head and the wall. A soft object could be a small pillow, a ball, or a folded towel. 3. Gently tilt your head back and press into the soft object. Keep your jaw and forehead relaxed. 4. Hold for _____10_____ seconds. 5. Release the tension slowly. Relax your neck muscles completely before you repeat this exercise. Repeat _______3___ times. Complete this exercise _____2_____ times a day. Posture and body mechanics Body mechanics refers to the movements and positions of your  body while you do your daily activities. Posture is part of body mechanics. Good posture and healthy body mechanics can help to relieve stress in your body's tissues and joints. Good posture means that your spine is in its natural S-curve position (your spine is neutral), your shoulders are pulled back slightly, and your head is not tipped forward. The following are general guidelines for applying improved posture and  body mechanics to your everyday activities. Standing   When standing, keep your spine neutral and keep your feet about hip-width apart. Keep a slight bend in your knees. Your ears, shoulders, and hips should line up.  When you do a task in which you stand in one place for a long time, place one foot up on a stable object that is 2-4 inches (5-10 cm) high, such as a footstool. This helps keep your spine neutral. Sitting   When sitting, keep your spine neutral and your keep feet flat on the floor. Use a footrest, if necessary, and keep your thighs parallel to the floor. Avoid rounding your shoulders, and avoid tilting your head forward.  When working at a desk or a computer, keep your desk at a height where your hands are slightly lower than your elbows. Slide your chair under your desk so you are close enough to maintain good posture.  When working at a computer, place your monitor at a height where you are looking straight ahead and you do not have to tilt your head forward or downward to look at the screen. Resting When lying down and resting, avoid positions that are most painful for you. Try to support your neck in a neutral position. You can use a contour pillow or a small rolled-up towel. Your pillow should support your neck but not push on it. This information is not intended to replace advice given to you by your health care provider. Make sure you discuss any questions you have with your health care provider. Document Released: 09/13/2005 Document Revised: 05/20/2016 Document Reviewed: 08/20/2015 Elsevier Interactive Patient Education  2019 [employer].  Fat and Cholesterol Restricted Eating Plan Eating a diet that limits fat and cholesterol may help lower your risk for heart disease and other conditions. Your body needs fat and cholesterol for basic functions, but eating too much of these things can be harmful to your health. Your health care provider may order lab tests to check  your blood fat (lipid) and cholesterol levels. This helps your health care provider understand your risk for certain conditions and whether you need to make diet changes. Work with your health care provider or dietitian to make an eating plan that is right for you. What are tips for following this plan? General guidelines   If you are overweight, work with your health care provider to lose weight safely. Losing just 5-10% of your body weight can improve your overall health and help prevent diseases such as diabetes and heart disease.  Avoid: ? Foods with added sugar. ? Fried foods. ? Foods that contain partially hydrogenated oils, including stick margarine, some tub margarines, cookies, crackers, and other baked goods.  Limit alcohol intake to no more than 1 drink a day for nonpregnant women and 2 drinks a day for men. One drink equals 12 oz of beer, 5 oz of wine, or 1 oz of hard liquor. Reading food labels  Check food labels for: ? Trans fats, partially hydrogenated oils, or high amounts of saturated fat. Avoid foods that contain saturated fat and  trans fat. ? The amount of cholesterol in each serving. Try to eat no more than 200 mg of cholesterol each day. ? The amount of fiber in each serving. Try to eat at least 20-30 g of fiber each day.  Choose foods with healthy fats, such as: ? Monounsaturated and polyunsaturated fats. These include olive and canola oil, flaxseeds, walnuts, almonds, and seeds. ? Omega-3 fats. These are found in foods such as salmon, mackerel, sardines, tuna, flaxseed oil, and ground flaxseeds.  Choose grain products that have whole grains. Look for the word "whole" as the first word in the ingredient list. Cooking  Cook foods using methods other than frying. Baking, boiling, grilling, and broiling are some healthy options.  Eat more home-cooked food and less restaurant, buffet, and fast food.  Avoid cooking using saturated fats. ? Animal sources of saturated  fats include meats, butter, and cream. ? Plant sources of saturated fats include palm oil, palm kernel oil, and coconut oil. Meal planning   At meals, imagine dividing your plate into fourths: ? Fill one-half of your plate with vegetables and green salads. ? Fill one-fourth of your plate with whole grains. ? Fill one-fourth of your plate with lean protein foods.  Eat fish that is high in omega-3 fats at least two times a week.  Eat more foods that contain fiber, such as whole grains, beans, apples, broccoli, carrots, peas, and barley. These foods help promote healthy cholesterol levels in the blood. Recommended foods Grains  Whole grains, such as whole wheat or whole grain breads, crackers, cereals, and pasta. Unsweetened oatmeal, bulgur, barley, quinoa, or brown rice. Corn or whole wheat flour tortillas. Vegetables  Fresh or frozen vegetables (raw, steamed, roasted, or grilled). Green salads. Fruits  All fresh, canned (in natural juice), or frozen fruits. Meats and other protein foods  Ground beef (85% or leaner), grass-fed beef, or beef trimmed of fat. Skinless chicken or [place]. Ground chicken or [place]. Pork trimmed of fat. All fish and seafood. Egg whites. Dried beans, peas, or lentils. Unsalted nuts or seeds. Unsalted canned beans. Natural nut butters without added sugar and oil. Dairy  Low-fat or nonfat dairy products, such as skim or 1% milk, 2% or reduced-fat cheeses, low-fat and fat-free ricotta or cottage cheese, or plain low-fat and nonfat yogurt. Fats and oils  Tub margarine without trans fats. Light or reduced-fat mayonnaise and salad dressings. Avocado. Olive, canola, sesame, or safflower oils. The items listed above may not be a complete list of recommended foods or beverages. Contact your dietitian for more options. Foods to avoid Grains  White bread. White pasta. White rice. Cornbread. Bagels, pastries, and croissants. Crackers and snack foods that contain trans  fat and hydrogenated oils. Vegetables  Vegetables cooked in cheese, cream, or butter sauce. Fried vegetables. Fruits  Canned fruit in heavy syrup. Fruit in cream or butter sauce. Fried fruit. Meats and other protein foods  Fatty cuts of meat. Ribs, chicken wings, bacon, sausage, bologna, salami, chitterlings, fatback, hot dogs, bratwurst, and packaged lunch meats. Liver and organ meats. Whole eggs and egg yolks. Chicken and [place] with skin. Fried meat. Dairy  Whole or 2% milk, cream, half-and-half, and cream cheese. Whole milk cheeses. Whole-fat or sweetened yogurt. Full-fat cheeses. Nondairy creamers and whipped toppings. Processed cheese, cheese spreads, and cheese curds. Beverages  Alcohol. Sugar-sweetened drinks such as sodas, lemonade, and fruit drinks. Fats and oils  Butter, stick margarine, lard, shortening, ghee, or bacon fat. Coconut, palm kernel, and palm oils. Sweets  and desserts  Corn syrup, sugars, honey, and molasses. Candy. Jam and jelly. Syrup. Sweetened cereals. Cookies, pies, cakes, donuts, muffins, and ice cream. The items listed above may not be a complete list of foods and beverages to avoid. Contact your dietitian for more information. Summary  Your body needs fat and cholesterol for basic functions. However, eating too much of these things can be harmful to your health.  Work with your health care provider and dietitian to follow a diet low in fat and cholesterol. Doing this may help lower your risk for heart disease and other conditions.  Choose healthy fats, such as monounsaturated and polyunsaturated fats, and foods high in omega-3 fatty acids.  Eat fiber-rich foods, such as whole grains, beans, peas, fruits, and vegetables.  Limit or avoid alcohol, fried foods, and foods high in saturated fats, partially hydrogenated oils, and sugar. This information is not intended to replace advice given to you by your health care provider. Make sure you discuss any  questions you have with your health care provider. Document Released: 09/13/2005 Document Revised: 05/31/2017 Document Reviewed: 05/31/2017 Elsevier Interactive Patient Education  2019 [employer].

## 2019-03-05 NOTE — Progress Notes (Signed)
Virtual Visit via Video Note  I connected with Bonnie Moon on 03/05/19 at  9:30 AM EDT by a video enabled telemedicine application and verified that I am speaking with the correct person using two identifiers.  Location patient: home Location provider:work office Persons participating in the virtual visit: patient, provider  I discussed the limitations of evaluation and management by telemedicine and the availability of in person appointments. The patient expressed understanding and agreed to proceed.   Bonnie Moon DOB: [birth year] Encounter date: 03/05/2019  This is a 68 y.o. female who presents to establish care. Chief Complaint  Patient presents with  . Establish Care    History of present illness: We have been seeing her/talking with her in last week due to new onset upper back/neck pain that radiates to left arm.  Completed bloodwork for chronic condition - stable although cholesterol elevated. Elected to hold off on statin start due to ongoing neck pain. Xray cevical spine 02/28/19 showed diffuse degen disc/facet disease with mod to severe right neural foraminal narrowing C3-6 and mild left C3-6. Tried meloxcam, valium.   We had reviewed problem/Chronic condition list at last visit.   Pain is a lot better - has used ice, heat, trying to do some range of motion exercises. Took one valium which didn't help. Then used the xanax just 2 nights and after that was able to loosen up. Has been sleeping well. Not getting as much pain down into arm. Not having that pain across lower neck/upper back.   Follows with ortho at [facility] - for right hip. Had left hip operated, but following with them for right hip pain which bothers her more with walking.   Past Medical History:  Diagnosis Date  . Allergy   . GERD (gastroesophageal reflux disease)   . Hyperlipidemia   . IBS (irritable bowel syndrome)   . Nephrolithiasis   . Osteopenia   . Thyroid nodule   . Tubular adenoma of colon 02/2009  .  Vitamin D insufficiency    Past Surgical History:  Procedure Laterality Date  . BREAST ENHANCEMENT SURGERY    . PARATHYROIDECTOMY  01/17/2017   done by Dr. Oran Rein- at [facility] in [place]  (1/2 parathyroid remains)  . TONSILLECTOMY    . 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 Lipitor [Atorvastatin Calcium]    Current Meds  Medication Sig  . ALPRAZolam (XANAX) 0.25 MG tablet 1 tab orally as needed for anxiety not to exceed more than two a day  . CALCIUM PO Take 630 mg by mouth daily.  . Cholecalciferol (VITAMIN D-3) 1000 UNITS CAPS Take 5,000 Units by mouth.   . irbesartan (AVAPRO) 150 MG tablet Take 1 tablet (150 mg total) by mouth daily.  . meloxicam (MOBIC) 7.5 MG tablet Take 1 tablet (7.5 mg total) by mouth daily.  . Multiple Vitamins-Minerals (MULTIVITAMIN PO) Take by mouth. gummies 2 daily  . [DISCONTINUED] irbesartan (AVAPRO) 150 MG tablet TAKE ONE TABLET BY MOUTH DAILY   Social History   Tobacco Use  . Smoking status: Never Smoker  . Smokeless tobacco: Never Used  Substance Use Topics  . Alcohol use: Yes    Alcohol/week: 2.0 standard drinks    Types: 2 Standard drinks or equivalent per week    Comment: 1-2 drinks occ   Family History  Problem Relation Age of Onset  . Atrial fibrillation Father   . Alzheimer's disease Father   . Other Father  pacemaker  . Osteopenia Mother   . Heart disease Mother        a. fib  . High Cholesterol Mother   . Heart failure Mother        pacemaker  . Thyroid disease Daughter        nodule, cancer  . High Cholesterol Daughter   . Other Sister        parathyroid  . Thyroid nodules Brother   . Colon cancer Neg Hx   . Pancreatic cancer Neg Hx   . Rectal cancer Neg Hx   . Stomach cancer Neg Hx      Review of Systems  Constitutional: Negative for chills, fatigue and fever.  Respiratory: Negative for cough, chest tightness, shortness of breath and wheezing.   Cardiovascular: Negative for chest  pain, palpitations and leg swelling.  Musculoskeletal: Negative for neck pain (improved) and neck stiffness.    Objective:  LMP 07/28/2002       BP Readings from Last 3 Encounters:  09/12/18 118/70  08/15/18 (!) 144/90  03/17/18 126/80   Wt Readings from Last 3 Encounters:  09/12/18 142 lb 3.2 oz (64.5 kg)  08/15/18 143 lb 3.2 oz (65 kg)  03/17/18 146 lb 9.6 oz (66.5 kg)    EXAM:  GENERAL: alert, oriented, appears well and in no acute distress  HEENT: atraumatic, conjunctiva clear, no obvious abnormalities on inspection of external nose and ears  NECK: normal movements of the head and neck  LUNGS: on inspection no signs of respiratory distress, breathing rate appears normal, no obvious gross SOB, gasping or wheezing  CV: no obvious cyanosis  MS: moves all visible extremities without noticeable abnormality  PSYCH/NEURO: pleasant and cooperative, no obvious depression or anxiety, speech and thought processing grossly intact  SKIN: no facial or neck abnormalities  Assessment/Plan  1. Hyperlipidemia, unspecified hyperlipidemia type Will try pravastatin.  Suggested starting with 1/2 tablet at night and if tolerating move up to a full tablet.  We will send low-cholesterol diet information as well. - pravastatin (PRAVACHOL) 20 MG tablet; Take 1 tablet (20 mg total) by mouth daily.  Dispense: 90 tablet; Refill: 1  2. Essential hypertension Has been well controlled; continue current medication.  3. Hyperparathyroidism, primary ([place]) Follows with endocrinology.    4. Cervical spine arthritis: Pain has resolved from previous visit.  If pain recurs could certainly consider more advanced imaging or specialty referral.  We discussed working on neck strengthening exercises to help give support to the cervical spine.  These will be mailed to her.  I discussed the assessment and treatment plan with the patient. The patient was provided an opportunity to ask questions and all were  answered. The patient agreed with the plan and demonstrated an understanding of the instructions.   The patient was advised to call back or seek an in-person evaluation if the symptoms worsen or if the condition fails to improve as anticipated.  I provided 30 minutes of non-face-to-face time during this encounter.   Micheline Rough, MD

## 2019-03-06 ENCOUNTER — Telehealth: Payer: Self-pay | Admitting: *Deleted

## 2019-03-06 NOTE — Telephone Encounter (Signed)
-----   Message from Caren Macadam, MD sent at 03/05/2019  2:59 PM EDT ----- Please also mail AVS to her with exercises/cholesterol info

## 2019-03-06 NOTE — Telephone Encounter (Signed)
-----   Message from Caren Macadam, MD sent at 03/05/2019 12:37 PM EDT ----- Please schedule labwork in 3 mo followed by [facility].

## 2019-03-06 NOTE — Telephone Encounter (Signed)
AVS mailed to the pts home address.

## 2019-03-06 NOTE — Telephone Encounter (Signed)
I called the pt and scheduled appts as below.  I also mailed a copy of the cervical spine x-rays from 6/3 to the home address per the pts request.

## 2019-04-13 ENCOUNTER — Telehealth: Payer: Self-pay

## 2019-04-13 NOTE — Telephone Encounter (Signed)
Noted  

## 2019-04-13 NOTE — Telephone Encounter (Signed)
Patient called and wanted to be seen for GI issues. Phone got disconnected. Call pt back and she stated someone talked to her and the problem was resolved. No further action needed!

## 2019-04-17 ENCOUNTER — Ambulatory Visit (INDEPENDENT_AMBULATORY_CARE_PROVIDER_SITE_OTHER): Payer: Medicare Other | Admitting: Family Medicine

## 2019-04-17 ENCOUNTER — Telehealth: Payer: Self-pay | Admitting: *Deleted

## 2019-04-17 ENCOUNTER — Other Ambulatory Visit: Payer: Self-pay

## 2019-04-17 ENCOUNTER — Telehealth: Payer: Self-pay | Admitting: Gastroenterology

## 2019-04-17 ENCOUNTER — Encounter: Payer: Self-pay | Admitting: Family Medicine

## 2019-04-17 DIAGNOSIS — R0989 Other specified symptoms and signs involving the circulatory and respiratory systems: Secondary | ICD-10-CM | POA: Diagnosis not present

## 2019-04-17 DIAGNOSIS — R09A2 Foreign body sensation, throat: Secondary | ICD-10-CM

## 2019-04-17 DIAGNOSIS — J302 Other seasonal allergic rhinitis: Secondary | ICD-10-CM

## 2019-04-17 DIAGNOSIS — K219 Gastro-esophageal reflux disease without esophagitis: Secondary | ICD-10-CM | POA: Diagnosis not present

## 2019-04-17 DIAGNOSIS — K5900 Constipation, unspecified: Secondary | ICD-10-CM

## 2019-04-17 NOTE — Telephone Encounter (Signed)
Appt scheduled for 8/4 at 1pm.

## 2019-04-17 NOTE — Progress Notes (Signed)
Virtual Visit via Video Note  I connected with Bonnie Moon  on 04/17/19 at 10:20 AM EDT by a video enabled telemedicine application and verified that I am speaking with the correct person using two identifiers.  Location patient: home Location provider:work or home office Persons participating in the virtual visit: patient, provider  I discussed the limitations of evaluation and management by telemedicine and the availability of in person appointments. The patient expressed understanding and agreed to proceed.   HPI:  Acute visit for a primary concern of cough and globus sensation -started in Feb 2020 -mild, feels like something in the throat -seems like has drainage in the throat in the morning, clears throat a lot during the day, some belching -last week had a bad globus sensation in the throat -pharmacist gave her Pepcid - this seemed to help -worse after eating and in the morning -worse when she goes out side -she does have mild nasal congestion and sneezing -has terrible allergies - was tested in the past -no fevers, SOB, abd pain, wait loss, dysphagia -no regular medications for allergies or acid reflux -she has a history of GERD and constipation as well -she has been using mirilax for the constipation  ROS: See pertinent positives and negatives per HPI.  Past Medical History:  Diagnosis Date  . Allergy   . GERD (gastroesophageal reflux disease)   . Hyperlipidemia   . IBS (irritable bowel syndrome)   . Nephrolithiasis   . Osteopenia   . Thyroid nodule   . Tubular adenoma of colon 02/2009  . Vitamin D insufficiency     Past Surgical History:  Procedure Laterality Date  . BREAST ENHANCEMENT SURGERY    . PARATHYROIDECTOMY  01/17/2017   done by Dr. Oran Rein- at [facility] in [place]  (1/2 parathyroid remains)  . TONSILLECTOMY    . 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 Atrial fibrillation Father   . Alzheimer's disease Father    . Other Father        pacemaker  . Osteopenia Mother   . Heart disease Mother        a. fib  . High Cholesterol Mother   . Heart failure Mother        pacemaker  . Thyroid disease Daughter        nodule, cancer  . High Cholesterol Daughter   . Other Sister        parathyroid  . Thyroid nodules Brother   . Colon cancer Neg Hx   . Pancreatic cancer Neg Hx   . Rectal cancer Neg Hx   . Stomach cancer Neg Hx     SOCIAL HX: see hpi   Current Outpatient Medications:  .  ALPRAZolam (XANAX) 0.25 MG tablet, 1 tab orally as needed for anxiety not to exceed more than two a day, Disp: 30 tablet, Rfl: 0 .  CALCIUM PO, Take 630 mg by mouth daily., Disp: , Rfl:  .  Cholecalciferol (VITAMIN D-3) 1000 UNITS CAPS, Take 5,000 Units by mouth. , Disp: , Rfl:  .  irbesartan (AVAPRO) 150 MG tablet, Take 1 tablet (150 mg total) by mouth daily., Disp: 90 tablet, Rfl: 1 .  meloxicam (MOBIC) 7.5 MG tablet, Take 1 tablet (7.5 mg total) by mouth daily., Disp: 30 tablet, Rfl: 0 .  Multiple Vitamins-Minerals (MULTIVITAMIN PO), Take by mouth. gummies 2 daily, Disp: , Rfl:  .  pravastatin (PRAVACHOL) 20 MG tablet, Take 1 tablet (20  mg total) by mouth daily., Disp: 90 tablet, Rfl: 1  EXAM:  VITALS per patient if applicable:  GENERAL: alert, oriented, appears well and in no acute distress  HEENT: atraumatic, conjunttiva clear, no obvious abnormalities on inspection of external nose and ears  NECK: normal movements of the head and neck  LUNGS: on inspection no signs of respiratory distress, breathing rate appears normal, no obvious gross SOB, gasping or wheezing  CV: no obvious cyanosis  MS: moves all visible extremities without noticeable abnormality  PSYCH/NEURO: pleasant and cooperative, no obvious depression or anxiety, speech and thought processing grossly intact  ASSESSMENT AND PLAN:  Discussed the following assessment and plan:  Gastroesophageal reflux disease, esophagitis presence not  specified   Globus sensation  Seasonal allergies  Constipation, unspecified constipation type   -we discussed possible serious and likely etiologies, workup and treatment, treatment risks and return precautions. Discussed that silent reflux and PND are common causes of these symptoms and opted to try empiric treatment for each first with close follow up and referral for endoscopy if worsening or not improving. -after this discussion, Shereece opted for trial of Nexium and behavior/diet changes for GERD for 2 weeks. Mag for constipation. Close follow up and then will decide addition or changing to allergy regimen vs referral vs holding the course. -follow up advised in 2 weeks -of course, we advised Soundra  to return or notify a doctor immediately if symptoms worsen or persist or new concerns arise.  .    I discussed the assessment and treatment plan with the patient. The patient was provided an opportunity to ask questions and all were answered. The patient agreed with the plan and demonstrated an understanding of the instructions.    Follow up instructions: Advised assistant Wendie Simmer to help patient arrange the following: -follow up in 2 weeks with Dr. Dimple Casey, DO    Patient Instructions  -start Nexium 20mg  daily for 2 weeks  -follows diet guidelines below  -Magnesium Glycinate liquid per instructions daily (Pure Encapsulations is a good options)  Follow up in 2 weeks  Food Choices for Gastroesophageal Reflux Disease, Adult When you have gastroesophageal reflux disease (GERD), the foods you eat and your eating habits are very important. Choosing the right foods can help ease your discomfort. Think about working with a nutrition specialist (dietitian) to help you make good choices. What are tips for following this plan?  Meals  Choose healthy foods that are low in fat, such as fruits, vegetables, whole grains, low-fat dairy products, and lean meat, fish, and poultry.   Eat small meals often instead of 3 large meals a day. Eat your meals slowly, and in a place where you are relaxed. Avoid bending over or lying down until 2-3 hours after eating.  Avoid eating meals 2-3 hours before bed.  Avoid drinking a lot of liquid with meals.  Cook foods using methods other than frying. Bake, grill, or broil food instead.  Avoid or limit: ? Chocolate. ? Peppermint or spearmint. ? Alcohol. ? Pepper. ? Black and decaffeinated coffee. ? Black and decaffeinated tea. ? Bubbly (carbonated) soft drinks. ? Caffeinated energy drinks and soft drinks.  Limit high-fat foods such as: ? Fatty meat or fried foods. ? Whole milk, cream, butter, or ice cream. ? Nuts and nut butters. ? Pastries, donuts, and sweets made with butter or shortening.  Avoid foods that cause symptoms. These foods may be different for everyone. Common foods that cause symptoms include: ?  Tomatoes. ? Oranges, lemons, and limes. ? Peppers. ? Spicy food. ? Onions and garlic. ? Vinegar. Lifestyle  Maintain a healthy weight. Ask your doctor what weight is healthy for you. If you need to lose weight, work with your doctor to do so safely.  Exercise for at least 30 minutes for 5 or more days each week, or as told by your doctor.  Wear loose-fitting clothes.  Do not smoke. If you need help quitting, ask your doctor.  Sleep with the head of your bed higher than your feet. Use a wedge under the mattress or blocks under the bed frame to raise the head of the bed. Summary  When you have gastroesophageal reflux disease (GERD), food and lifestyle choices are very important in easing your symptoms.  Eat small meals often instead of 3 large meals a day. Eat your meals slowly, and in a place where you are relaxed.  Limit high-fat foods such as fatty meat or fried foods.  Avoid bending over or lying down until 2-3 hours after eating.  Avoid peppermint and spearmint, caffeine, alcohol, and chocolate.  This information is not intended to replace advice given to you by your health care provider. Make sure you discuss any questions you have with your health care provider. Document Released: 03/14/2012 Document Revised: 01/04/2019 Document Reviewed: 10/19/2016 Elsevier Patient Education  2020 [employer].

## 2019-04-17 NOTE — Patient Instructions (Addendum)
-  start Nexium 20mg  daily for 2 weeks  -follows diet guidelines below  -Magnesium Glycinate liquid per instructions daily (Pure Encapsulations is a good options)  Follow up in 2 weeks  Food Choices for Gastroesophageal Reflux Disease, Adult When you have gastroesophageal reflux disease (GERD), the foods you eat and your eating habits are very important. Choosing the right foods can help ease your discomfort. Think about working with a nutrition specialist (dietitian) to help you make good choices. What are tips for following this plan?  Meals  Choose healthy foods that are low in fat, such as fruits, vegetables, whole grains, low-fat dairy products, and lean meat, fish, and poultry.  Eat small meals often instead of 3 large meals a day. Eat your meals slowly, and in a place where you are relaxed. Avoid bending over or lying down until 2-3 hours after eating.  Avoid eating meals 2-3 hours before bed.  Avoid drinking a lot of liquid with meals.  Cook foods using methods other than frying. Bake, grill, or broil food instead.  Avoid or limit: ? Chocolate. ? Peppermint or spearmint. ? Alcohol. ? Pepper. ? Black and decaffeinated coffee. ? Black and decaffeinated tea. ? Bubbly (carbonated) soft drinks. ? Caffeinated energy drinks and soft drinks.  Limit high-fat foods such as: ? Fatty meat or fried foods. ? Whole milk, cream, butter, or ice cream. ? Nuts and nut butters. ? Pastries, donuts, and sweets made with butter or shortening.  Avoid foods that cause symptoms. These foods may be different for everyone. Common foods that cause symptoms include: ? Tomatoes. ? Oranges, lemons, and limes. ? Peppers. ? Spicy food. ? Onions and garlic. ? Vinegar. Lifestyle  Maintain a healthy weight. Ask your doctor what weight is healthy for you. If you need to lose weight, work with your doctor to do so safely.  Exercise for at least 30 minutes for 5 or more days each week, or as told by  your doctor.  Wear loose-fitting clothes.  Do not smoke. If you need help quitting, ask your doctor.  Sleep with the head of your bed higher than your feet. Use a wedge under the mattress or blocks under the bed frame to raise the head of the bed. Summary  When you have gastroesophageal reflux disease (GERD), food and lifestyle choices are very important in easing your symptoms.  Eat small meals often instead of 3 large meals a day. Eat your meals slowly, and in a place where you are relaxed.  Limit high-fat foods such as fatty meat or fried foods.  Avoid bending over or lying down until 2-3 hours after eating.  Avoid peppermint and spearmint, caffeine, alcohol, and chocolate. This information is not intended to replace advice given to you by your health care provider. Make sure you discuss any questions you have with your health care provider. Document Released: 03/14/2012 Document Revised: 01/04/2019 Document Reviewed: 10/19/2016 Elsevier Patient Education  2020 [employer].

## 2019-04-17 NOTE — Telephone Encounter (Signed)
Patient is offered an appt with Tye Savoy RNP for 05/02/19.  She has been added to the cancellation list.

## 2019-04-17 NOTE — Telephone Encounter (Signed)
Patient called stating that she is having a lot of burping, acid reflux, stool changing to green and lots of stomach pain. She is wanting to get in to see the doctor asap

## 2019-04-17 NOTE — Telephone Encounter (Signed)
-----   Message from [name], DO sent at 04/17/2019 10:47 AM EDT ----- Follow up in 2 weeks with Dr. Maudie Mercury

## 2019-04-18 ENCOUNTER — Encounter: Payer: Self-pay | Admitting: Gastroenterology

## 2019-04-27 DIAGNOSIS — E559 Vitamin D deficiency, unspecified: Secondary | ICD-10-CM | POA: Diagnosis not present

## 2019-04-27 DIAGNOSIS — E785 Hyperlipidemia, unspecified: Secondary | ICD-10-CM | POA: Diagnosis not present

## 2019-04-27 DIAGNOSIS — E042 Nontoxic multinodular goiter: Secondary | ICD-10-CM | POA: Diagnosis not present

## 2019-04-27 DIAGNOSIS — R739 Hyperglycemia, unspecified: Secondary | ICD-10-CM | POA: Diagnosis not present

## 2019-05-01 ENCOUNTER — Other Ambulatory Visit: Payer: Self-pay

## 2019-05-01 ENCOUNTER — Ambulatory Visit (INDEPENDENT_AMBULATORY_CARE_PROVIDER_SITE_OTHER): Payer: Medicare Other | Admitting: Family Medicine

## 2019-05-01 ENCOUNTER — Encounter: Payer: Self-pay | Admitting: Family Medicine

## 2019-05-01 DIAGNOSIS — H2513 Age-related nuclear cataract, bilateral: Secondary | ICD-10-CM | POA: Diagnosis not present

## 2019-05-01 DIAGNOSIS — R0989 Other specified symptoms and signs involving the circulatory and respiratory systems: Secondary | ICD-10-CM

## 2019-05-01 DIAGNOSIS — E785 Hyperlipidemia, unspecified: Secondary | ICD-10-CM

## 2019-05-01 DIAGNOSIS — K219 Gastro-esophageal reflux disease without esophagitis: Secondary | ICD-10-CM | POA: Diagnosis not present

## 2019-05-01 DIAGNOSIS — H04123 Dry eye syndrome of bilateral lacrimal glands: Secondary | ICD-10-CM | POA: Diagnosis not present

## 2019-05-01 NOTE — Progress Notes (Signed)
Virtual Visit via Video Note  I connected with Bonnie Moon  on 05/01/19 at  1:00 PM EDT by a video enabled telemedicine application and verified that I am speaking with the correct person using two identifiers.  Location patient: home Location provider:work or home office Persons participating in the virtual visit: patient, provider  I discussed the limitations of evaluation and management by telemedicine and the availability of in person appointments. The patient expressed understanding and agreed to proceed.   HPI:  Acute visit for Globus Sensation: -reports " oh my goodness I feel so much better!" " the nexium has really helped!" -cough and globus sensation so much better -she finished the 2 week course of nexium yesterday and she has changed her diet -she has an appointment with her gastroenterologist tomorrow, she will be getting her colonoscopy soon -she saw her endocrinologist and they will do an [place] of her neck for her thyroid and they did a bunch of lab work -has questions about her statin and cholesterol  ROS: See pertinent positives and negatives per HPI.  Past Medical History:  Diagnosis Date  . Allergy   . GERD (gastroesophageal reflux disease)   . Hyperlipidemia   . IBS (irritable bowel syndrome)   . Nephrolithiasis   . Osteopenia   . Thyroid nodule   . Tubular adenoma of colon 02/2009  . Vitamin D insufficiency     Past Surgical History:  Procedure Laterality Date  . BREAST ENHANCEMENT SURGERY    . PARATHYROIDECTOMY  01/17/2017   done by Dr. Oran Rein- at [facility] in [place]  (1/2 parathyroid remains)  . TONSILLECTOMY    . 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 Atrial fibrillation Father   . Alzheimer's disease Father   . Other Father        pacemaker  . Osteopenia Mother   . Heart disease Mother        a. fib  . High Cholesterol Mother   . Heart failure Mother        pacemaker  . Thyroid disease Daughter    nodule, cancer  . High Cholesterol Daughter   . Other Sister        parathyroid  . Thyroid nodules Brother   . Colon cancer Neg Hx   . Pancreatic cancer Neg Hx   . Rectal cancer Neg Hx   . Stomach cancer Neg Hx     SOCIAL HX: see hpi   Current Outpatient Medications:  .  ALPRAZolam (XANAX) 0.25 MG tablet, 1 tab orally as needed for anxiety not to exceed more than two a day, Disp: 30 tablet, Rfl: 0 .  CALCIUM PO, Take 630 mg by mouth daily., Disp: , Rfl:  .  Cholecalciferol (VITAMIN D-3) 1000 UNITS CAPS, Take 5,000 Units by mouth. , Disp: , Rfl:  .  irbesartan (AVAPRO) 150 MG tablet, Take 1 tablet (150 mg total) by mouth daily., Disp: 90 tablet, Rfl: 1 .  Multiple Vitamins-Minerals (MULTIVITAMIN PO), Take by mouth. gummies 2 daily, Disp: , Rfl:  .  pravastatin (PRAVACHOL) 20 MG tablet, Take 1 tablet (20 mg total) by mouth daily., Disp: 90 tablet, Rfl: 1  EXAM:  VITALS per patient if applicable:  GENERAL: alert, oriented, appears well and in no acute distress  HEENT: atraumatic, conjunttiva clear, no obvious abnormalities on inspection of external nose and ears  NECK: normal movements of the head and neck  LUNGS: on inspection no signs of  respiratory distress, breathing rate appears normal, no obvious gross SOB, gasping or wheezing  CV: no obvious cyanosis  MS: moves all visible extremities without noticeable abnormality  PSYCH/NEURO: pleasant and cooperative, no obvious depression or anxiety, speech and thought processing grossly intact  ASSESSMENT AND PLAN:  Discussed the following assessment and plan:  Globus sensation -resolved, suspect GERD as cause, possibly multifactorial with allergies  Gastroesophageal reflux disease, esophagitis presence not specified - Plan:  -doing much better, responded great to short course of nexium -now try dietary treatment moving forward, prn pepcid or short course of nexium in the future if needed -she sees GI tomorrow, due for colon  cancer screening f/u and will address with GI   Hyperlipidemia, unspecified hyperlipidemia type  -she had questions about her cholesterol and statin -she had elevated lipids on last check, but reports was not taking her pravastatin, will start back on it -follow up as needed if any intolerance and as scheduled, she wants to keep the appt with Dr. Ethlyn Gallery to meet her in person and follow up     I discussed the assessment and treatment plan with the patient. The patient was provided an opportunity to ask questions and all were answered. The patient agreed with the plan and demonstrated an understanding of the instructions.   The patient was advised to call back or seek an in-person evaluation if the symptoms worsen or if the condition fails to improve as anticipated.   Bonnie Kern, DO

## 2019-05-02 ENCOUNTER — Encounter: Payer: Self-pay | Admitting: Nurse Practitioner

## 2019-05-02 ENCOUNTER — Encounter

## 2019-05-02 ENCOUNTER — Ambulatory Visit (INDEPENDENT_AMBULATORY_CARE_PROVIDER_SITE_OTHER): Payer: Medicare Other | Admitting: Nurse Practitioner

## 2019-05-02 VITALS — BP 148/90 | HR 60 | Temp 98.6°F | Ht 63.0 in | Wt 135.6 lb

## 2019-05-02 DIAGNOSIS — K5909 Other constipation: Secondary | ICD-10-CM

## 2019-05-02 DIAGNOSIS — Z86010 Personal history of colonic polyps: Secondary | ICD-10-CM | POA: Diagnosis not present

## 2019-05-02 DIAGNOSIS — K219 Gastro-esophageal reflux disease without esophagitis: Secondary | ICD-10-CM

## 2019-05-02 NOTE — Progress Notes (Signed)
ASSESSMENT / PLAN:   51. 68 year old female with cough, throat clearing and regurgitation (clear salty fluid).  Symptoms probably secondary to GERD especially since they were preceded a few weeks early by heartburn.  After 2-week course of Nexium as prescribed by PCP patient symptoms have resolved -Antireflux measures discussed.  She should go to bed on an empty stomach, elevate head of bed/invest in a wedge pillow, reduce caffeine consumption, etc.. Written GERD literature given. -For recurrent symptoms she can try Pepcid AC 1-2 times daily. Call if has persistent symptoms  2. Chronic constipation.  She takes MiraLAX several times a week, drinks water throughout the day.  Asking for other recommendations -Trial of Benefiber once daily.  -Continue good hydration and MiraLAX as needed  3. History of adenomatous colon polyps, no last surveillance colonoscopy 2015.  Based on current guidelines her next surveillance colonoscopy is due July 2022  4.  Anxiety.  She admits to being more anxious during the COVID endemic  HPI:    Chief Complaint:   Patient is a 68 year old female with a history of hyperparathyroidism, and anxiety. She is known remotely to Dr. Fuller Plan for history of adenomatous colon polyps, GERD, and chronic constipation.  A few occasions in her life patient has had GERD symptoms such as heartburn.  A few weeks ago she developed recurrent heartburn and started over-the-counter Pepcid.  Pepcid helped the heartburn but she later developed cough, throat clearing and regurgitation. She consulted with PCP who prescribed 2-week course of Nexium.  After about the fifth day on Nexium her symptoms subsided.  She completed the Nexium on Monday, feels much better.  Odynophagia or dysphagia.  She drinks only decaffeinated coffee.  Birdia gives a history of anxiety.  She has been more anxious lately for the [place] pandemic.  She also admits to sinus problems but does not feel her current  symptoms are from postnasal drip  Patient has a history of chronic constipation, she takes MiraLAX several times a week.  She drinks water throughout the day but is interested in any other recommendations for management.  No rectal bleeding.  No abdominal pain.  Data Reviewed:  02/28/2019 CMET normal CBC normal   Past Medical History:  Diagnosis Date   Allergy    GERD (gastroesophageal reflux disease)    Hyperlipidemia    IBS (irritable bowel syndrome)    Nephrolithiasis    Osteopenia    Thyroid nodule    Tubular adenoma of colon 02/2009   Vitamin D insufficiency      Past Surgical History:  Procedure Laterality Date   BREAST ENHANCEMENT SURGERY     PARATHYROIDECTOMY  01/17/2017   done by Dr. Oran Rein- at [facility] in [place]  (1/2 parathyroid remains)   TONSILLECTOMY     TOTAL HIP ARTHROPLASTY Left 05/2008   Family History  Problem Relation Age of Onset   Atrial fibrillation Father    Alzheimer's disease Father    Other Father        pacemaker   Osteopenia Mother    Heart disease Mother        a. fib   High Cholesterol Mother    Heart failure Mother        pacemaker   Thyroid disease Daughter        nodule, cancer   High Cholesterol Daughter    Other Sister        parathyroid  Thyroid nodules Brother    Colon cancer Neg Hx    Pancreatic cancer Neg Hx    Rectal cancer Neg Hx    Stomach cancer Neg Hx    Social History   Tobacco Use   Smoking status: Never Smoker   Smokeless tobacco: Never Used  Substance Use Topics   Alcohol use: Yes    Alcohol/week: 2.0 standard drinks    Types: 2 Standard drinks or equivalent per week    Comment: 1-2 drinks occ   Drug use: No   Current Outpatient Medications  Medication Sig Dispense Refill   ALPRAZolam (XANAX) 0.25 MG tablet 1 tab orally as needed for anxiety not to exceed more than two a day 30 tablet 0   CALCIUM PO Take 630 mg by mouth daily.     Cholecalciferol (VITAMIN D-3) 1000 UNITS  CAPS Take 5,000 Units by mouth.      irbesartan (AVAPRO) 150 MG tablet Take 1 tablet (150 mg total) by mouth daily. 90 tablet 1   Multiple Vitamins-Minerals (MULTIVITAMIN PO) Take by mouth. gummies 2 daily     pravastatin (PRAVACHOL) 20 MG tablet Take 1 tablet (20 mg total) by mouth daily. 90 tablet 1   No current facility-administered medications for this visit.    Allergies  Allergen Reactions   Lipitor [Atorvastatin Calcium]      Review of Systems: Positive for allergies, anxiety and cough. All systems reviewed and negative except where noted in HPI.   Creatinine clearance cannot be calculated (Patient's most recent lab result is older than the maximum 21 days allowed.)   Physical Exam:    Wt Readings from Last 3 Encounters:  05/02/19 135 lb 9.6 oz (61.5 kg)  09/12/18 142 lb 3.2 oz (64.5 kg)  08/15/18 143 lb 3.2 oz (65 kg)    BP (!) 148/90    Pulse 60    Temp 98.6 F (37 C) (Oral)    Ht 5\' 3"  (1.6 m)    Wt 135 lb 9.6 oz (61.5 kg)    LMP 07/28/2002    BMI 24.02 kg/m  Constitutional:  Pleasant female in no acute distress. Psychiatric: Normal mood and affect. Behavior is normal. EENT: Pupils normal.  Conjunctivae are normal. No scleral icterus. Neck supple.  Cardiovascular: Normal rate, regular rhythm. No edema Pulmonary/chest: Effort normal and breath sounds normal. No wheezing, rales or rhonchi. Abdominal: Soft, nondistended, nontender. Bowel sounds active throughout. There are no masses palpable. No hepatomegaly. Neurological: Alert and oriented to person place and time. Skin: Skin is warm and dry. No rashes noted.  I spent 25 minutes of face-to-face time with the patient. Greater than 50% of the time was spent counseling and coordinating care. Questions answered   Tye Savoy, NP  05/02/2019, 1:54 PM

## 2019-05-02 NOTE — Patient Instructions (Signed)
If you are age 66 or older, your body mass index should be between 23-30. Your Body mass index is 24.02 kg/m. If this is out of the aforementioned range listed, please consider follow up with your Primary Care Provider.  If you are age 57 or younger, your body mass index should be between 19-25. Your Body mass index is 24.02 kg/m. If this is out of the aformentioned range listed, please consider follow up with your Primary Care Provider.   Start Benefiber daily.  Continue Miralax if needed.  You have been given GERD literature.  If recurrent GERD symptoms, resume Pepcid (over-the-counter)  Thank you for choosing me and [facility].   Tye Savoy, NP

## 2019-05-02 NOTE — Progress Notes (Signed)
Reviewed and agree with management plan.  Indie Boehne T. Lillard Bailon, MD FACG 

## 2019-05-04 DIAGNOSIS — M810 Age-related osteoporosis without current pathological fracture: Secondary | ICD-10-CM | POA: Diagnosis not present

## 2019-05-04 DIAGNOSIS — K689 Other disorders of retroperitoneum: Secondary | ICD-10-CM | POA: Diagnosis not present

## 2019-05-04 DIAGNOSIS — I10 Essential (primary) hypertension: Secondary | ICD-10-CM | POA: Diagnosis not present

## 2019-05-04 DIAGNOSIS — E559 Vitamin D deficiency, unspecified: Secondary | ICD-10-CM | POA: Diagnosis not present

## 2019-05-04 DIAGNOSIS — R739 Hyperglycemia, unspecified: Secondary | ICD-10-CM | POA: Diagnosis not present

## 2019-05-04 DIAGNOSIS — E042 Nontoxic multinodular goiter: Secondary | ICD-10-CM | POA: Diagnosis not present

## 2019-05-04 DIAGNOSIS — E210 Primary hyperparathyroidism: Secondary | ICD-10-CM | POA: Diagnosis not present

## 2019-05-04 DIAGNOSIS — E785 Hyperlipidemia, unspecified: Secondary | ICD-10-CM | POA: Diagnosis not present

## 2019-06-06 ENCOUNTER — Other Ambulatory Visit: Payer: Medicare Other

## 2019-06-13 ENCOUNTER — Ambulatory Visit: Payer: Medicare Other | Admitting: Family Medicine

## 2019-06-27 ENCOUNTER — Other Ambulatory Visit: Payer: Self-pay

## 2019-06-28 NOTE — Progress Notes (Signed)
68 y.o. G4P3 Widowed White or Caucasian female here for annual exam.  She is having some increased issues with her right hip.  Saw surgeon who did the left hip replacement.    Having some more issues with thyroid levels.  Has thyroid nodules and one is enlarged.  Has follow up in November.  Will have blood work done and nuclear test done.    Blood pressure is elevated today but pt is feeling a lot of anxiety.  Does check it at home and levels are better at home.   She is on irbesartan.    Cholesterol was elevated as well so now on cholesterol medication.    Denies vaginal bleeding.  Thinking about getting a flu shot.  She has appt in two weeks with PCP.  Is going to check about pneumonia vaccination.  If she hasn't done it, she will get it then.  Patient's last menstrual period was 07/28/2002.          Sexually active: No.  The current method of family planning is abstinence.    Exercising: No.  The patient does not participate in regular exercise at present. Smoker:  no  Health Maintenance: Pap: 12/28/16 Neg. HR HPV:neg              10/14/14 neg  History of abnormal Pap:  no MMG:  08/16/18 Bi-rads 1 neg  Colonoscopy: 04/11/14 normal. F/u 7 years.  Saw Dr. Fuller Plan this year and guidelines now are 7 years BMD:   08/16/2018.  -2.8.  Declines treatment TDaP:  2019 Pneumonia vaccine(s):  Not sure this has been done Shingrix:   No  Hep C testing: not sure  Screening Labs: Endocrinology    reports that she has never smoked. She has never used smokeless tobacco. She reports current alcohol use of about 2.0 standard drinks of alcohol per week. She reports that she does not use drugs.  Past Medical History:  Diagnosis Date  . Allergy   . GERD (gastroesophageal reflux disease)   . Hyperlipidemia   . IBS (irritable bowel syndrome)   . Nephrolithiasis   . Osteopenia   . Thyroid nodule   . Tubular adenoma of colon 02/2009  . Vitamin D insufficiency     Past Surgical History:  Procedure  Laterality Date  . BREAST ENHANCEMENT SURGERY    . PARATHYROIDECTOMY  01/17/2017   done by Dr. Oran Rein- at [facility] in [place]  (1/2 parathyroid remains)  . TONSILLECTOMY    . TOTAL HIP ARTHROPLASTY Left 05/2008    Current Outpatient Medications  Medication Sig Dispense Refill  . ALPRAZolam (XANAX) 0.25 MG tablet 1 tab orally as needed for anxiety not to exceed more than two a day 30 tablet 0  . CALCIUM PO Take 630 mg by mouth daily.    . Cholecalciferol (VITAMIN D-3) 1000 UNITS CAPS Take 5,000 Units by mouth.     . irbesartan (AVAPRO) 150 MG tablet Take 1 tablet (150 mg total) by mouth daily. 90 tablet 1  . Multiple Vitamins-Minerals (MULTIVITAMIN PO) Take by mouth. gummies 2 daily    . pravastatin (PRAVACHOL) 20 MG tablet Take 1 tablet (20 mg total) by mouth daily. 90 tablet 1   No current facility-administered medications for this visit.     Family History  Problem Relation Age of Onset  . Atrial fibrillation Father   . Alzheimer's disease Father   . Other Father        pacemaker  . Osteopenia Mother   .  Heart disease Mother        a. fib  . High Cholesterol Mother   . Heart failure Mother        pacemaker  . Thyroid disease Daughter        nodule, cancer  . High Cholesterol Daughter   . Other Sister        parathyroid  . Thyroid nodules Brother   . Colon cancer Neg Hx   . Pancreatic cancer Neg Hx   . Rectal cancer Neg Hx   . Stomach cancer Neg Hx     Review of Systems  All other systems reviewed and are negative.   Exam:   Vitals:   06/29/19 1050  BP: (!) 160/90  Pulse: 76  Temp: (!) 96.8 F (36 C)  SpO2: 98%    General appearance: alert, cooperative and appears stated age Head: Normocephalic, without obvious abnormality, atraumatic Neck: no adenopathy, supple, symmetrical, trachea midline and thyroid normal to inspection and palpation Lungs: clear to auscultation bilaterally Breasts: normal appearance, no masses or tenderness Heart: regular rate and  rhythm Abdomen: soft, non-tender; bowel sounds normal; no masses,  no organomegaly Extremities: extremities normal, atraumatic, no cyanosis or edema Skin: Skin color, texture, turgor normal. No rashes or lesions Lymph nodes: Cervical, supraclavicular, and axillary nodes normal. No abnormal inguinal nodes palpated Neurologic: Grossly normal   Pelvic: External genitalia:  no lesions              Urethra:  normal appearing urethra with no masses, tenderness or lesions              Bartholins and Skenes: normal                 Vagina: normal appearing vagina with normal color and discharge, no lesions              Cervix: no lesions              Pap taken: No. Bimanual Exam:  Uterus:  normal size, contour, position, consistency, mobility, non-tender              Adnexa: normal adnexa and no mass, fullness, tenderness               Rectovaginal: Confirms               Anus:  normal sphincter tone, no lesions  Chaperone was present for exam.  A:  Well Woman with normal exam PMP, no HRT H/o hyperparathyroidism, s/p resection of 3 1/2 parathyroid glands Thyroid nodules, followed by Shawna Orleans, endocrinologist in [place] Osteoporosis, declines treatment Hypertension Elevated lipids  P:   Mammogram guidelines reviewed.  This is UTD. pap smear 4/18.  Not indicated today. Blood work will be done in two weeks Discussed with pt shingrix vaccination, flu shots and pneumonia vaccination.  She will double check with PCP in two weeks when has appt Colonoscopy due in 2022, not this year, per Dr. Fuller Plan return annually or prn

## 2019-06-29 ENCOUNTER — Other Ambulatory Visit: Payer: Self-pay

## 2019-06-29 ENCOUNTER — Ambulatory Visit (INDEPENDENT_AMBULATORY_CARE_PROVIDER_SITE_OTHER): Payer: Medicare Other | Admitting: Obstetrics & Gynecology

## 2019-06-29 ENCOUNTER — Encounter: Payer: Self-pay | Admitting: Obstetrics & Gynecology

## 2019-06-29 ENCOUNTER — Encounter

## 2019-06-29 VITALS — BP 160/90 | HR 76 | Temp 96.8°F | Ht 63.0 in | Wt 139.8 lb

## 2019-06-29 DIAGNOSIS — Z01419 Encounter for gynecological examination (general) (routine) without abnormal findings: Secondary | ICD-10-CM | POA: Diagnosis not present

## 2019-06-29 DIAGNOSIS — Z124 Encounter for screening for malignant neoplasm of cervix: Secondary | ICD-10-CM | POA: Diagnosis not present

## 2019-07-09 ENCOUNTER — Encounter: Payer: Self-pay | Admitting: Family Medicine

## 2019-07-09 ENCOUNTER — Ambulatory Visit (INDEPENDENT_AMBULATORY_CARE_PROVIDER_SITE_OTHER): Payer: Medicare Other | Admitting: Family Medicine

## 2019-07-09 ENCOUNTER — Other Ambulatory Visit: Payer: Self-pay

## 2019-07-09 VITALS — BP 130/80 | HR 66 | Temp 97.5°F | Ht 63.0 in | Wt 140.1 lb

## 2019-07-09 DIAGNOSIS — E785 Hyperlipidemia, unspecified: Secondary | ICD-10-CM

## 2019-07-09 DIAGNOSIS — Z23 Encounter for immunization: Secondary | ICD-10-CM | POA: Diagnosis not present

## 2019-07-09 DIAGNOSIS — E041 Nontoxic single thyroid nodule: Secondary | ICD-10-CM

## 2019-07-09 DIAGNOSIS — Z9889 Other specified postprocedural states: Secondary | ICD-10-CM

## 2019-07-09 DIAGNOSIS — E892 Postprocedural hypoparathyroidism: Secondary | ICD-10-CM

## 2019-07-09 DIAGNOSIS — I10 Essential (primary) hypertension: Secondary | ICD-10-CM

## 2019-07-09 NOTE — Progress Notes (Signed)
Bonnie Moon DOB: June 02, 1951 Encounter date: 07/09/2019  This is a 68 y.o. female who presents with Chief Complaint  Patient presents with  . Follow-up    History of present illness: Did really well with the nexium. Day before yesterday took a pepcid for heartburn and it went away.   Did bring results from endocrinologist (has another panel in November and then follow up in December) (Dr. Shawna Orleans). Numbers are going back up; they are keeping eye on thins. They are wanting to do nuclear scan and monitoring nodule.   Neck and shoulder are so much better from our previous visit. Has been exercising, walking some. Resumed pravastatin.  Saw Dr. Sabra Heck recently. Had some immunization follow up questions. She is curious about getting flu shot. Never had this before. Also asking about pneumonia shot.   Congestion, worse at night. Fine if she is moving around during day. (discussed singular potential for future)  Does check bp at home. Can feel it raise with stress. Can feel it go up.   Allergies  Allergen Reactions  . Lipitor [Atorvastatin Calcium]    Current Meds  Medication Sig  . ALPRAZolam (XANAX) 0.25 MG tablet 1 tab orally as needed for anxiety not to exceed more than two a day  . Cholecalciferol (VITAMIN D-3) 1000 UNITS CAPS Take 5,000 Units by mouth.   . Famotidine (PEPCID PO) Take by mouth as needed.  . irbesartan (AVAPRO) 150 MG tablet Take 1 tablet (150 mg total) by mouth daily.  . Multiple Vitamins-Minerals (MULTIVITAMIN PO) Take by mouth. gummies 2 daily  . pravastatin (PRAVACHOL) 20 MG tablet Take 1 tablet (20 mg total) by mouth daily.  . [DISCONTINUED] CALCIUM PO Take 630 mg by mouth daily.    Review of Systems  Constitutional: Negative for chills, fatigue and fever.  Respiratory: Negative for cough, chest tightness, shortness of breath and wheezing.   Cardiovascular: Negative for chest pain, palpitations and leg swelling.    Objective:  BP 130/80 (BP Location: Left  Arm, Patient Position: Sitting, Cuff Size: Normal)   Pulse 66   Temp (!) 97.5 F (36.4 C) (Temporal)   Ht 5\' 3"  (1.6 m)   Wt 140 lb 1.6 oz (63.5 kg)   LMP 07/28/2002   SpO2 97%   BMI 24.82 kg/m   Weight: 140 lb 1.6 oz (63.5 kg)   BP Readings from Last 3 Encounters:  07/09/19 130/80  06/29/19 (!) 160/90  05/02/19 (!) 148/90   Wt Readings from Last 3 Encounters:  07/09/19 140 lb 1.6 oz (63.5 kg)  06/29/19 139 lb 12.8 oz (63.4 kg)  05/02/19 135 lb 9.6 oz (61.5 kg)    Physical Exam Constitutional:      General: She is not in acute distress.    Appearance: She is well-developed.  Cardiovascular:     Rate and Rhythm: Normal rate and regular rhythm.     Heart sounds: Normal heart sounds. No murmur. No friction rub.  Pulmonary:     Effort: Pulmonary effort is normal. No respiratory distress.     Breath sounds: Normal breath sounds. No wheezing or rales.  Musculoskeletal:     Right lower leg: No edema.     Left lower leg: No edema.  Neurological:     Mental Status: She is alert and oriented to person, place, and time.  Psychiatric:        Behavior: Behavior normal.     Assessment/Plan  1. Need for pneumococcal vaccination - Pneumococcal polysaccharide vaccine 23-valent greater  than or equal to 2yo subcutaneous/IM  2. Essential hypertension She frequently feels that bp spikes with stress. We are going to have her increase irbesartan to 1.5 tabs daily. She will update me through mychart in 1 months time with numbers. We would like for her to be within a normal, healthy range, even when feeling some anxiety.  3. S/P parathyroidectomy Following w endocrinology regularly.  4. Thyroid nodule Followed by endocrinology. She also appears hyperthyroid by TSH; with T/3/4 in normal range. She will be getting repeat bloodwork in November  5. Hyperlipidemia: tolerating pravastatin. Was on for a month with 30point drop in LDL. She has resumed (although missed some dosing due to neck  pain when we held med) so we will follow along with upcoming November labwork.     Return for mychart update in 1 month; 6 month CCV to follow.     Micheline Rough, MD

## 2019-07-09 NOTE — Patient Instructions (Addendum)
Consider xyzal for allergy symptoms.   Increase irbesartan to 1.5 tabs daily. Update me in 1 months time through mychart with numbers.

## 2019-07-18 ENCOUNTER — Telehealth: Payer: Self-pay | Admitting: Family Medicine

## 2019-07-18 MED ORDER — AMOXICILLIN-POT CLAVULANATE 875-125 MG PO TABS: 1.0000 | ORAL_TABLET | Freq: Two times a day (BID) | ORAL | 0 refills | Status: DC

## 2019-07-18 NOTE — Telephone Encounter (Signed)
I called the pt and informed her of the message below.  Patient stated she preferred to take Augmentin first, is aware this was sent to her pharmacy and will call back if needed.

## 2019-07-18 NOTE — Telephone Encounter (Signed)
Patient calling to inquire if Dr. Ethlyn Gallery would prescribe an antibiotic for her congestion. She states this was discussed at [facility] on 10/12. Patient is requesting call back to discuss further.

## 2019-07-18 NOTE — Telephone Encounter (Signed)
We also discussed considering adding on singulair 10mg  daily to help with allergy symptoms.  I am okay with sending in an antibiotic if she feels that sinus congestion has worsened or she is feeling some pressure within the sinuses.  She has not had very many antibiotics historically, but she has taken amoxicillin before.  We could do Augmentin 875 mg p.o. twice daily x5 days.   I am ok with sending in either or both of above. OK for 90-day dose of Singulair if desired.  She can start with the Augmentin otherwise, and let me know if she feels symptoms or not improved after treatment.

## 2019-08-20 ENCOUNTER — Other Ambulatory Visit: Payer: Self-pay | Admitting: Family Medicine

## 2019-08-20 MED ORDER — IRBESARTAN 150 MG PO TABS: 150.0000 mg | ORAL_TABLET | Freq: Every day | ORAL | 1 refills | Status: DC

## 2019-08-20 NOTE — Telephone Encounter (Signed)
Copied from [place] [id number]. Topic: Quick Communication - Rx Refill/Question >> Aug 20, 2019 10:58 AM Yvette Rack wrote: Medication: irbesartan (AVAPRO) 150 MG tablet  Has the patient contacted their pharmacy? no  Preferred Pharmacy (with phone number or street name): [facility], [place], [place] [phone] (Phone) [phone] (Fax)  Agent: Please be advised that RX refills may take up to 3 business days. We ask that you follow-up with your pharmacy.

## 2019-08-22 ENCOUNTER — Telehealth: Payer: Self-pay | Admitting: *Deleted

## 2019-08-22 ENCOUNTER — Other Ambulatory Visit: Payer: Self-pay | Admitting: Family Medicine

## 2019-08-22 DIAGNOSIS — E042 Nontoxic multinodular goiter: Secondary | ICD-10-CM | POA: Diagnosis not present

## 2019-08-22 DIAGNOSIS — E559 Vitamin D deficiency, unspecified: Secondary | ICD-10-CM | POA: Diagnosis not present

## 2019-08-22 DIAGNOSIS — E785 Hyperlipidemia, unspecified: Secondary | ICD-10-CM | POA: Diagnosis not present

## 2019-08-22 DIAGNOSIS — R739 Hyperglycemia, unspecified: Secondary | ICD-10-CM | POA: Diagnosis not present

## 2019-08-22 MED ORDER — IRBESARTAN 150 MG PO TABS: ORAL_TABLET | ORAL | 1 refills | Status: DC

## 2019-08-22 NOTE — Telephone Encounter (Signed)
Copied from [place] [id number]. Topic: General - Other >> Aug 22, 2019 11:01 AM Yvette Rack wrote: Reason for CRM: Alwyn Ren with [facility] called to request updated Rx for irbesartan (AVAPRO) 150 MG tablet. Alwyn Ren stated pt informed her that the Rx was increased to 1 and half pills daily.

## 2019-08-22 NOTE — Telephone Encounter (Signed)
Noted  

## 2019-08-22 NOTE — Telephone Encounter (Signed)
New rx sent in

## 2019-08-29 DIAGNOSIS — Z23 Encounter for immunization: Secondary | ICD-10-CM | POA: Diagnosis not present

## 2019-08-31 DIAGNOSIS — R946 Abnormal results of thyroid function studies: Secondary | ICD-10-CM | POA: Diagnosis not present

## 2019-08-31 DIAGNOSIS — I10 Essential (primary) hypertension: Secondary | ICD-10-CM | POA: Diagnosis not present

## 2019-08-31 DIAGNOSIS — E785 Hyperlipidemia, unspecified: Secondary | ICD-10-CM | POA: Diagnosis not present

## 2019-08-31 DIAGNOSIS — E210 Primary hyperparathyroidism: Secondary | ICD-10-CM | POA: Diagnosis not present

## 2019-08-31 DIAGNOSIS — R739 Hyperglycemia, unspecified: Secondary | ICD-10-CM | POA: Diagnosis not present

## 2019-08-31 DIAGNOSIS — E042 Nontoxic multinodular goiter: Secondary | ICD-10-CM | POA: Diagnosis not present

## 2019-08-31 DIAGNOSIS — E559 Vitamin D deficiency, unspecified: Secondary | ICD-10-CM | POA: Diagnosis not present

## 2019-09-28 DIAGNOSIS — T8859XA Other complications of anesthesia, initial encounter: Secondary | ICD-10-CM

## 2019-09-28 HISTORY — DX: Other complications of anesthesia, initial encounter: T88.59XA

## 2019-10-17 DIAGNOSIS — Z23 Encounter for immunization: Secondary | ICD-10-CM | POA: Diagnosis not present

## 2019-10-19 ENCOUNTER — Other Ambulatory Visit: Payer: Self-pay | Admitting: *Deleted

## 2019-10-19 DIAGNOSIS — E785 Hyperlipidemia, unspecified: Secondary | ICD-10-CM

## 2019-10-19 MED ORDER — PRAVASTATIN SODIUM 20 MG PO TABS: 20.0000 mg | ORAL_TABLET | Freq: Every day | ORAL | 0 refills | Status: DC

## 2019-11-12 ENCOUNTER — Telehealth: Payer: Self-pay | Admitting: Family Medicine

## 2019-11-12 NOTE — Telephone Encounter (Signed)
disregard

## 2019-12-14 ENCOUNTER — Encounter: Payer: Self-pay | Admitting: Family Medicine

## 2019-12-14 ENCOUNTER — Ambulatory Visit (INDEPENDENT_AMBULATORY_CARE_PROVIDER_SITE_OTHER): Payer: Medicare Other | Admitting: Family Medicine

## 2019-12-14 ENCOUNTER — Other Ambulatory Visit: Payer: Self-pay

## 2019-12-14 VITALS — BP 150/90 | HR 64 | Temp 97.6°F | Ht 63.0 in | Wt 149.0 lb

## 2019-12-14 DIAGNOSIS — I10 Essential (primary) hypertension: Secondary | ICD-10-CM | POA: Diagnosis not present

## 2019-12-14 DIAGNOSIS — E538 Deficiency of other specified B group vitamins: Secondary | ICD-10-CM

## 2019-12-14 DIAGNOSIS — E785 Hyperlipidemia, unspecified: Secondary | ICD-10-CM | POA: Diagnosis not present

## 2019-12-14 LAB — VITAMIN B12: Vitamin B-12: 473 pg/mL (ref 200–1100)

## 2019-12-14 NOTE — Progress Notes (Signed)
[name] DOB: [birth year] Encounter date: 12/14/2019  This is a 69 y.o. female who presents with Chief Complaint  Patient presents with  . Follow-up    History of present illness: Things are going ok health wise. Has put on weight; thinks related to right hip. When walking on track she can't go as long as she used to. Planning to have hip replaced in fall. Doesn't have access to swimming pool near her.   Doesn't feel like energy level hasn't been what she expected.   HTN: has been checking at home - states that it is just high when she is at doc office.    Allergies  Allergen Reactions  . Lipitor [Atorvastatin Calcium]    Current Meds  Medication Sig  . ALPRAZolam (XANAX) 0.25 MG tablet 1 tab orally as needed for anxiety not to exceed more than two a day  . Ascorbic Acid (VITAMIN C PO) Take by mouth.  . Cholecalciferol (VITAMIN D-3) 1000 UNITS CAPS Take 5,000 Units by mouth.   . Multiple Vitamins-Minerals (MULTIVITAMIN PO) Take by mouth. gummies 2 daily  . pravastatin (PRAVACHOL) 20 MG tablet Take 1 tablet (20 mg total) by mouth daily.  . [DISCONTINUED] irbesartan (AVAPRO) 150 MG tablet Take 1.5 tablets PO daily    Review of Systems  Constitutional: Negative for chills, fatigue and fever.  Respiratory: Negative for cough, chest tightness, shortness of breath and wheezing.   Cardiovascular: Negative for chest pain, palpitations and leg swelling.    Objective:  BP (!) 150/90 (BP Location: Left Arm, Patient Position: Sitting, Cuff Size: Normal)   Pulse 64   Temp 97.6 F (36.4 C) (Temporal)   Ht 5\' 3"  (1.6 m)   Wt 149 lb (67.6 kg)   LMP 07/28/2002   BMI 26.39 kg/m   Weight: 149 lb (67.6 kg)   BP Readings from Last 3 Encounters:  12/14/19 (!) 150/90  07/09/19 130/80  06/29/19 (!) 160/90   Wt Readings from Last 3 Encounters:  12/14/19 149 lb (67.6 kg)  07/09/19 140 lb 1.6 oz (63.5 kg)  06/29/19 139 lb 12.8 oz (63.4 kg)    Physical Exam Constitutional:    General: She is not in acute distress.    Appearance: She is well-developed.  Cardiovascular:     Rate and Rhythm: Normal rate and regular rhythm.     Heart sounds: Normal heart sounds. No murmur. No friction rub.  Pulmonary:     Effort: Pulmonary effort is normal. No respiratory distress.     Breath sounds: Normal breath sounds. No wheezing or rales.  Musculoskeletal:     Right lower leg: No edema.     Left lower leg: No edema.  Neurological:     Mental Status: She is alert and oriented to person, place, and time.  Psychiatric:        Behavior: Behavior normal.     Assessment/Plan  1. Vitamin B 12 deficiency - Vitamin B12; Future - Vitamin B12  2. Essential hypertension suboptimal control at doc office. Insurance is requiring change from irbesartan to losartan.  We will start at 100 mg daily dose, which is slightly higher than current dose of irbesartan equivalent, so that hopefully we can obtain better blood pressure control around the clock. She will let me know about transition after a couple of weeks being on new medication.   3. Hyperlipidemia, unspecified hyperlipidemia type Continue with pravastatin.    Return in about 6 months (around 06/15/2020) for physical exam; and pending  blood pressure report.     Micheline Rough, MD

## 2019-12-17 MED ORDER — LOSARTAN POTASSIUM 100 MG PO TABS: 100.0000 mg | ORAL_TABLET | Freq: Every day | ORAL | 3 refills | Status: DC

## 2020-01-03 ENCOUNTER — Encounter: Payer: Self-pay | Admitting: Family Medicine

## 2020-01-03 ENCOUNTER — Telehealth: Payer: Self-pay | Admitting: Family Medicine

## 2020-01-03 ENCOUNTER — Telehealth (INDEPENDENT_AMBULATORY_CARE_PROVIDER_SITE_OTHER): Payer: Medicare Other | Admitting: Family Medicine

## 2020-01-03 DIAGNOSIS — J310 Chronic rhinitis: Secondary | ICD-10-CM | POA: Diagnosis not present

## 2020-01-03 DIAGNOSIS — J329 Chronic sinusitis, unspecified: Secondary | ICD-10-CM

## 2020-01-03 DIAGNOSIS — Z8719 Personal history of other diseases of the digestive system: Secondary | ICD-10-CM

## 2020-01-03 DIAGNOSIS — J309 Allergic rhinitis, unspecified: Secondary | ICD-10-CM | POA: Diagnosis not present

## 2020-01-03 MED ORDER — AMOXICILLIN-POT CLAVULANATE 875-125 MG PO TABS: 1.0000 | ORAL_TABLET | Freq: Two times a day (BID) | ORAL | 0 refills | Status: DC

## 2020-01-03 NOTE — Telephone Encounter (Signed)
Spoke with the pt and informed her a visit is needed for evaluation since this is a new problem. Virtual visit scheduled for today at 1pm with Dr Maudie Mercury.

## 2020-01-03 NOTE — Progress Notes (Signed)
Virtual Visit via Video Note  I connected with Bonnie Moon  on 01/03/20 at  1:00 PM EDT by a video enabled telemedicine application and verified that I am speaking with the correct person using two identifiers.  Location patient: home, [place] Location provider:work or home office Persons participating in the virtual visit: patient, provider  I discussed the limitations of evaluation and management by telemedicine and the availability of in person appointments. The patient expressed understanding and agreed to proceed.   HPI:  Acute visit for sinus congestion: -for the last few weeks with allergies -symptoms: sinus congestion, "clogged sinuses", PND for weeks with the allergies -the last few days sinus "feel like they are going to explode", saw some thick green sinus congestion, sinus pain -currently not taking anything for sinuses -denies fever, chills, SOB, CP, sick contacts, loss of taste or smell -reports is fully vaccinated against COVID19, 2nd dose more than 2 weeks ago - and uses covid precuations -has history fo GERD with globus sensation and history of allergic rhinitis  ROS: See pertinent positives and negatives per HPI.  Past Medical History:  Diagnosis Date  . Allergy   . GERD (gastroesophageal reflux disease)   . Hyperlipidemia   . IBS (irritable bowel syndrome)   . Nephrolithiasis   . Osteopenia   . Thyroid nodule   . Tubular adenoma of colon 02/2009  . Vitamin D insufficiency     Past Surgical History:  Procedure Laterality Date  . BREAST ENHANCEMENT SURGERY    . PARATHYROIDECTOMY  01/17/2017   done by Dr. Oran Rein- at [facility] in [place]  (1/2 parathyroid remains)  . TONSILLECTOMY    . 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 Atrial fibrillation Father   . Alzheimer's disease Father   . Other Father        pacemaker  . Osteopenia Mother   . Heart disease Mother        a. fib  . High Cholesterol Mother   . Heart failure  Mother        pacemaker  . Other Mother        parathyroid disease  . Thyroid disease Daughter        nodule, cancer  . High Cholesterol Daughter   . Other Sister        parathyroid  . Thyroid nodules Brother   . Colon cancer Neg Hx   . Pancreatic cancer Neg Hx   . Rectal cancer Neg Hx   . Stomach cancer Neg Hx     SOCIAL HX: see hpi   Current Outpatient Medications:  .  ALPRAZolam (XANAX) 0.25 MG tablet, 1 tab orally as needed for anxiety not to exceed more than two a day, Disp: 30 tablet, Rfl: 0 .  Ascorbic Acid (VITAMIN C PO), Take by mouth., Disp: , Rfl:  .  Cholecalciferol (VITAMIN D-3) 1000 UNITS CAPS, Take 5,000 Units by mouth. , Disp: , Rfl:  .  losartan (COZAAR) 100 MG tablet, Take 1 tablet (100 mg total) by mouth daily., Disp: 90 tablet, Rfl: 3 .  Multiple Vitamins-Minerals (MULTIVITAMIN PO), Take by mouth. gummies 2 daily, Disp: , Rfl:  .  pravastatin (PRAVACHOL) 20 MG tablet, Take 1 tablet (20 mg total) by mouth daily., Disp: 90 tablet, Rfl: 0 .  amoxicillin-clavulanate (AUGMENTIN) 875-125 MG tablet, Take 1 tablet by mouth 2 (two) times daily., Disp: 20 tablet, Rfl: 0  EXAM:  VITALS per patient if applicable:  GENERAL: alert, oriented, appears well and in no acute distress  HEENT: atraumatic, conjunttiva clear, no obvious abnormalities on inspection of external nose and ears  NECK: normal movements of the head and neck  LUNGS: on inspection no signs of respiratory distress, breathing rate appears normal, no obvious gross SOB, gasping or wheezing  CV: no obvious cyanosis  MS: moves all visible extremities without noticeable abnormality  PSYCH/NEURO: pleasant and cooperative, no obvious depression or anxiety, speech and thought processing grossly intact  ASSESSMENT AND PLAN:  Discussed the following assessment and plan:  Allergic rhinitis, unspecified seasonality, unspecified trigger  History of gastroesophageal reflux (GERD)  Rhinosinusitis  -we  discussed possible serious and likely etiologies, options for evaluation and workup, limitations of telemedicine visit vs in person visit, treatment, treatment risks and precautions. Pt prefers to treat via telemedicine empirically rather then risking or undertaking an in person visit at this moment. She has a history of GERD and AR causing sinus issues in the past. I think this is more likely uncontrolled spring allergies with possible developing 2ndary sinusitis. Opted to try starting allergy regimen of flonase and zyrtec along with nasal saline, Augmentin 875 bid x 10 days if any worsening or not improving. Monitor for GERD issues. May needs to treat for this if not resolving with treatment of allergies/sinusitis. Patient agrees to seek prompt in person care if worsening, new symptoms arise, or if is not improving with treatment.   I discussed the assessment and treatment plan with the patient. The patient was provided an opportunity to ask questions and all were answered. The patient agreed with the plan and demonstrated an understanding of the instructions.   The patient was advised to call back or seek an in-person evaluation if the symptoms worsen or if the condition fails to improve as anticipated.   [name], DO

## 2020-01-03 NOTE — Telephone Encounter (Signed)
Pt has a sinus infection for a 3 weeks. Pt would like to know if medication can be called in to the pharmacy or if she needs to be seen? Pt is requesting a call to speak to you directly. Thanks

## 2020-01-10 DIAGNOSIS — Z1231 Encounter for screening mammogram for malignant neoplasm of breast: Secondary | ICD-10-CM | POA: Diagnosis not present

## 2020-02-11 DIAGNOSIS — L820 Inflamed seborrheic keratosis: Secondary | ICD-10-CM | POA: Diagnosis not present

## 2020-02-11 DIAGNOSIS — L821 Other seborrheic keratosis: Secondary | ICD-10-CM | POA: Diagnosis not present

## 2020-02-11 DIAGNOSIS — D225 Melanocytic nevi of trunk: Secondary | ICD-10-CM | POA: Diagnosis not present

## 2020-02-15 DIAGNOSIS — J309 Allergic rhinitis, unspecified: Secondary | ICD-10-CM | POA: Diagnosis not present

## 2020-02-15 DIAGNOSIS — J019 Acute sinusitis, unspecified: Secondary | ICD-10-CM | POA: Diagnosis not present

## 2020-02-29 ENCOUNTER — Telehealth: Payer: Self-pay | Admitting: Family Medicine

## 2020-02-29 DIAGNOSIS — E785 Hyperlipidemia, unspecified: Secondary | ICD-10-CM

## 2020-02-29 MED ORDER — PRAVASTATIN SODIUM 20 MG PO TABS: 20.0000 mg | ORAL_TABLET | Freq: Every day | ORAL | 1 refills | Status: DC

## 2020-02-29 NOTE — Telephone Encounter (Signed)
Pt is requesting a refill on Pravastatin 20 mg. Pt is also requesting to speak to you first before doing the refill. Thanks

## 2020-02-29 NOTE — Telephone Encounter (Signed)
I called the pt and she stated she talked with her pharmacy earlier and they told the Rx has not been sent to them.  Patient was informed that refills were sent to [facility].

## 2020-04-02 DIAGNOSIS — M810 Age-related osteoporosis without current pathological fracture: Secondary | ICD-10-CM | POA: Diagnosis not present

## 2020-04-02 DIAGNOSIS — E559 Vitamin D deficiency, unspecified: Secondary | ICD-10-CM | POA: Diagnosis not present

## 2020-04-02 DIAGNOSIS — E890 Postprocedural hypothyroidism: Secondary | ICD-10-CM | POA: Diagnosis not present

## 2020-04-02 DIAGNOSIS — Z79899 Other long term (current) drug therapy: Secondary | ICD-10-CM | POA: Diagnosis not present

## 2020-04-02 DIAGNOSIS — M1611 Unilateral primary osteoarthritis, right hip: Secondary | ICD-10-CM | POA: Diagnosis not present

## 2020-04-02 DIAGNOSIS — I10 Essential (primary) hypertension: Secondary | ICD-10-CM | POA: Diagnosis not present

## 2020-04-02 DIAGNOSIS — M47812 Spondylosis without myelopathy or radiculopathy, cervical region: Secondary | ICD-10-CM | POA: Diagnosis not present

## 2020-04-02 DIAGNOSIS — Z96642 Presence of left artificial hip joint: Secondary | ICD-10-CM | POA: Diagnosis not present

## 2020-04-02 DIAGNOSIS — M6588 Other synovitis and tenosynovitis, other site: Secondary | ICD-10-CM | POA: Diagnosis not present

## 2020-04-02 DIAGNOSIS — Z471 Aftercare following joint replacement surgery: Secondary | ICD-10-CM | POA: Diagnosis not present

## 2020-04-02 DIAGNOSIS — E785 Hyperlipidemia, unspecified: Secondary | ICD-10-CM | POA: Diagnosis not present

## 2020-04-02 DIAGNOSIS — R001 Bradycardia, unspecified: Secondary | ICD-10-CM | POA: Diagnosis not present

## 2020-04-02 DIAGNOSIS — Z96641 Presence of right artificial hip joint: Secondary | ICD-10-CM | POA: Diagnosis not present

## 2020-04-02 HISTORY — PX: TOTAL HIP ARTHROPLASTY: SHX124

## 2020-04-03 DIAGNOSIS — E890 Postprocedural hypothyroidism: Secondary | ICD-10-CM | POA: Diagnosis not present

## 2020-04-03 DIAGNOSIS — M47812 Spondylosis without myelopathy or radiculopathy, cervical region: Secondary | ICD-10-CM | POA: Diagnosis not present

## 2020-04-03 DIAGNOSIS — M810 Age-related osteoporosis without current pathological fracture: Secondary | ICD-10-CM | POA: Diagnosis not present

## 2020-04-03 DIAGNOSIS — M1611 Unilateral primary osteoarthritis, right hip: Secondary | ICD-10-CM | POA: Diagnosis not present

## 2020-04-03 DIAGNOSIS — I10 Essential (primary) hypertension: Secondary | ICD-10-CM | POA: Diagnosis not present

## 2020-04-03 DIAGNOSIS — M6588 Other synovitis and tenosynovitis, other site: Secondary | ICD-10-CM | POA: Diagnosis not present

## 2020-04-17 DIAGNOSIS — M1611 Unilateral primary osteoarthritis, right hip: Secondary | ICD-10-CM | POA: Diagnosis not present

## 2020-04-17 DIAGNOSIS — Z96641 Presence of right artificial hip joint: Secondary | ICD-10-CM | POA: Diagnosis not present

## 2020-04-29 ENCOUNTER — Encounter: Payer: Self-pay | Admitting: Obstetrics & Gynecology

## 2020-05-05 DIAGNOSIS — M1611 Unilateral primary osteoarthritis, right hip: Secondary | ICD-10-CM | POA: Diagnosis not present

## 2020-05-05 DIAGNOSIS — Z471 Aftercare following joint replacement surgery: Secondary | ICD-10-CM | POA: Diagnosis not present

## 2020-05-05 DIAGNOSIS — M6281 Muscle weakness (generalized): Secondary | ICD-10-CM | POA: Diagnosis not present

## 2020-05-05 DIAGNOSIS — Z96641 Presence of right artificial hip joint: Secondary | ICD-10-CM | POA: Diagnosis not present

## 2020-05-07 DIAGNOSIS — M1611 Unilateral primary osteoarthritis, right hip: Secondary | ICD-10-CM | POA: Diagnosis not present

## 2020-05-07 DIAGNOSIS — M6281 Muscle weakness (generalized): Secondary | ICD-10-CM | POA: Diagnosis not present

## 2020-05-07 DIAGNOSIS — Z471 Aftercare following joint replacement surgery: Secondary | ICD-10-CM | POA: Diagnosis not present

## 2020-05-07 DIAGNOSIS — Z96641 Presence of right artificial hip joint: Secondary | ICD-10-CM | POA: Diagnosis not present

## 2020-05-12 DIAGNOSIS — Z96641 Presence of right artificial hip joint: Secondary | ICD-10-CM | POA: Diagnosis not present

## 2020-05-12 DIAGNOSIS — Z471 Aftercare following joint replacement surgery: Secondary | ICD-10-CM | POA: Diagnosis not present

## 2020-05-12 DIAGNOSIS — M6281 Muscle weakness (generalized): Secondary | ICD-10-CM | POA: Diagnosis not present

## 2020-05-12 DIAGNOSIS — M1611 Unilateral primary osteoarthritis, right hip: Secondary | ICD-10-CM | POA: Diagnosis not present

## 2020-05-14 DIAGNOSIS — M6281 Muscle weakness (generalized): Secondary | ICD-10-CM | POA: Diagnosis not present

## 2020-05-14 DIAGNOSIS — M1611 Unilateral primary osteoarthritis, right hip: Secondary | ICD-10-CM | POA: Diagnosis not present

## 2020-05-14 DIAGNOSIS — Z96641 Presence of right artificial hip joint: Secondary | ICD-10-CM | POA: Diagnosis not present

## 2020-05-14 DIAGNOSIS — Z471 Aftercare following joint replacement surgery: Secondary | ICD-10-CM | POA: Diagnosis not present

## 2020-05-26 DIAGNOSIS — Z96641 Presence of right artificial hip joint: Secondary | ICD-10-CM | POA: Diagnosis not present

## 2020-05-26 DIAGNOSIS — M1611 Unilateral primary osteoarthritis, right hip: Secondary | ICD-10-CM | POA: Diagnosis not present

## 2020-05-26 DIAGNOSIS — Z471 Aftercare following joint replacement surgery: Secondary | ICD-10-CM | POA: Diagnosis not present

## 2020-05-26 DIAGNOSIS — M6281 Muscle weakness (generalized): Secondary | ICD-10-CM | POA: Diagnosis not present

## 2020-05-28 DIAGNOSIS — M1611 Unilateral primary osteoarthritis, right hip: Secondary | ICD-10-CM | POA: Diagnosis not present

## 2020-05-28 DIAGNOSIS — Z471 Aftercare following joint replacement surgery: Secondary | ICD-10-CM | POA: Diagnosis not present

## 2020-05-28 DIAGNOSIS — Z96641 Presence of right artificial hip joint: Secondary | ICD-10-CM | POA: Diagnosis not present

## 2020-05-28 DIAGNOSIS — M6281 Muscle weakness (generalized): Secondary | ICD-10-CM | POA: Diagnosis not present

## 2020-06-16 ENCOUNTER — Encounter: Payer: Self-pay | Admitting: Family Medicine

## 2020-06-16 ENCOUNTER — Ambulatory Visit (INDEPENDENT_AMBULATORY_CARE_PROVIDER_SITE_OTHER): Payer: Medicare Other | Admitting: Family Medicine

## 2020-06-16 ENCOUNTER — Other Ambulatory Visit: Payer: Self-pay

## 2020-06-16 VITALS — BP 140/90 | HR 52 | Temp 98.1°F | Ht 63.0 in | Wt 148.6 lb

## 2020-06-16 DIAGNOSIS — M810 Age-related osteoporosis without current pathological fracture: Secondary | ICD-10-CM | POA: Diagnosis not present

## 2020-06-16 DIAGNOSIS — I10 Essential (primary) hypertension: Secondary | ICD-10-CM

## 2020-06-16 DIAGNOSIS — J309 Allergic rhinitis, unspecified: Secondary | ICD-10-CM | POA: Diagnosis not present

## 2020-06-16 DIAGNOSIS — F419 Anxiety disorder, unspecified: Secondary | ICD-10-CM | POA: Diagnosis not present

## 2020-06-16 DIAGNOSIS — E785 Hyperlipidemia, unspecified: Secondary | ICD-10-CM | POA: Diagnosis not present

## 2020-06-16 MED ORDER — LOSARTAN POTASSIUM-HCTZ 100-12.5 MG PO TABS: 1.0000 | ORAL_TABLET | Freq: Every day | ORAL | 3 refills | Status: DC

## 2020-06-16 MED ORDER — SHINGRIX 50 MCG/0.5ML IM SUSR: 0.5000 mL | Freq: Once | INTRAMUSCULAR | 0 refills | Status: AC

## 2020-06-16 MED ORDER — CETIRIZINE HCL 10 MG PO TABS: 10.0000 mg | ORAL_TABLET | Freq: Every day | ORAL | 3 refills | Status: DC

## 2020-06-16 MED ORDER — ALPRAZOLAM 0.25 MG PO TABS
ORAL_TABLET | ORAL | 0 refills | Status: DC
Start: 2020-06-16 — End: 2023-09-14

## 2020-06-16 NOTE — Progress Notes (Signed)
Bonnie Moon DOB: Jul 03, 1951 Encounter date: 06/16/2020  This is a 69 y.o. female who presents with No chief complaint on file.   History of present illness: Hip replacement - 04/02/20. Has done well with replacement. Feels recovery a little slower (40 years older than last one). Riding stationary bike daily. Does better with bike than walking, but looking forward to walking back on the track. Wants to get weight back to 130's.   Not sleeping well at night. Waking after an hour or two. Not sure what is waking her. No pain. Takes time to fall back asleep. Not sleeping well.   HTN: has been checking at home - states that it is just high when she is at doc office  Breathing has been good, but worried about 94% O2. HR after surgery was in 40's.   Anxiety has been better.   Sees endocrinology in 2 weeks. Had missed last visit.   Allergies  Allergen Reactions  . Lipitor [Atorvastatin Calcium]    Current Meds  Medication Sig  . ALPRAZolam (XANAX) 0.25 MG tablet 1 tab orally as needed for anxiety not to exceed more than two a day  . Ascorbic Acid (VITAMIN C PO) Take by mouth.  . Cholecalciferol (VITAMIN D-3) 1000 UNITS CAPS Take 5,000 Units by mouth.   . Multiple Vitamins-Minerals (MULTIVITAMIN PO) Take by mouth. gummies 2 daily  . pravastatin (PRAVACHOL) 20 MG tablet Take 1 tablet (20 mg total) by mouth daily.  . [DISCONTINUED] ALPRAZolam (XANAX) 0.25 MG tablet 1 tab orally as needed for anxiety not to exceed more than two a day  . [DISCONTINUED] amoxicillin-clavulanate (AUGMENTIN) 875-125 MG tablet Take 1 tablet by mouth 2 (two) times daily.  . [DISCONTINUED] Cetirizine HCl (ZYRTEC PO) Take 10 mg by mouth daily.  . [DISCONTINUED] losartan (COZAAR) 100 MG tablet Take 1 tablet (100 mg total) by mouth daily.    Review of Systems  Constitutional: Negative for chills, fatigue and fever.  Respiratory: Negative for cough, chest tightness, shortness of breath and wheezing.   Cardiovascular:  Negative for chest pain, palpitations and leg swelling.    Objective:  BP 140/90 (BP Location: Left Arm, Patient Position: Sitting, Cuff Size: Normal)   Pulse (!) 52   Temp 98.1 F (36.7 C) (Oral)   Ht 5\' 3"  (1.6 m)   Wt 148 lb 9.6 oz (67.4 kg)   LMP 07/28/2002   SpO2 94%   BMI 26.32 kg/m   Weight: 148 lb 9.6 oz (67.4 kg)   BP Readings from Last 3 Encounters:  06/16/20 140/90  12/14/19 (!) 150/90  07/09/19 130/80   Wt Readings from Last 3 Encounters:  06/16/20 148 lb 9.6 oz (67.4 kg)  12/14/19 149 lb (67.6 kg)  07/09/19 140 lb 1.6 oz (63.5 kg)    Physical Exam Constitutional:      General: She is not in acute distress.    Appearance: She is well-developed.  Cardiovascular:     Rate and Rhythm: Normal rate and regular rhythm.     Heart sounds: Normal heart sounds. No murmur heard.  No friction rub.  Pulmonary:     Effort: Pulmonary effort is normal. No respiratory distress.     Breath sounds: Normal breath sounds. No wheezing or rales.  Musculoskeletal:     Right lower leg: No edema.     Left lower leg: No edema.  Neurological:     Mental Status: She is alert and oriented to person, place, and time.  Psychiatric:  Behavior: Behavior normal.     Assessment/Plan  1. Essential hypertension suboptimal control. Will add hctz to losartan. - losartan-hydrochlorothiazide (HYZAAR) 100-12.5 MG tablet; Take 1 tablet by mouth daily.  Dispense: 90 tablet; Refill: 3  2. Hyperlipidemia, unspecified hyperlipidemia type Continue with pravastatin.   3. Osteoporosis, unspecified osteoporosis type, unspecified pathological fracture presence Follows with endo for bone density. Has visit in 2 weeks.   4. Anxiety Well controlled. Rare use of xanax.   5. Allergic rhinitis, unspecified seasonality, unspecified trigger - cetirizine (ZYRTEC ALLERGY) 10 MG tablet; Take 1 tablet (10 mg total) by mouth daily.  Dispense: 90 tablet; Refill: 3   Return in about 6 months (around  12/14/2020) for Chronic condition visit (and physical).  Had DEXA (07/2018) and mammogram in August 2021.  Due for colonoscopy in 2022 with Dr. Fuller Plan.    Micheline Rough, MD

## 2020-06-30 DIAGNOSIS — E042 Nontoxic multinodular goiter: Secondary | ICD-10-CM | POA: Diagnosis not present

## 2020-06-30 DIAGNOSIS — R739 Hyperglycemia, unspecified: Secondary | ICD-10-CM | POA: Diagnosis not present

## 2020-06-30 DIAGNOSIS — E559 Vitamin D deficiency, unspecified: Secondary | ICD-10-CM | POA: Diagnosis not present

## 2020-06-30 DIAGNOSIS — E785 Hyperlipidemia, unspecified: Secondary | ICD-10-CM | POA: Diagnosis not present

## 2020-07-03 DIAGNOSIS — M810 Age-related osteoporosis without current pathological fracture: Secondary | ICD-10-CM | POA: Diagnosis not present

## 2020-07-03 DIAGNOSIS — I10 Essential (primary) hypertension: Secondary | ICD-10-CM | POA: Diagnosis not present

## 2020-07-03 DIAGNOSIS — E559 Vitamin D deficiency, unspecified: Secondary | ICD-10-CM | POA: Diagnosis not present

## 2020-07-03 DIAGNOSIS — E042 Nontoxic multinodular goiter: Secondary | ICD-10-CM | POA: Diagnosis not present

## 2020-07-03 DIAGNOSIS — E210 Primary hyperparathyroidism: Secondary | ICD-10-CM | POA: Diagnosis not present

## 2020-07-03 DIAGNOSIS — E785 Hyperlipidemia, unspecified: Secondary | ICD-10-CM | POA: Diagnosis not present

## 2020-07-03 DIAGNOSIS — R739 Hyperglycemia, unspecified: Secondary | ICD-10-CM | POA: Diagnosis not present

## 2020-09-13 DIAGNOSIS — Z23 Encounter for immunization: Secondary | ICD-10-CM | POA: Diagnosis not present

## 2020-09-18 ENCOUNTER — Telehealth: Payer: Self-pay | Admitting: Family Medicine

## 2020-09-18 NOTE — Telephone Encounter (Signed)
Spoke with pt she will call back after 1st of year to  schedule Medicare Annual Wellness Visit (AWV) either virtually or in office.   Last AWV no information  please schedule at anytime with [facility] Nurse Health Advisor 1 or 2   This should be a 45 minute visit.

## 2020-09-30 ENCOUNTER — Ambulatory Visit: Payer: Medicare Other | Admitting: Obstetrics & Gynecology

## 2020-10-27 ENCOUNTER — Telehealth: Payer: Self-pay | Admitting: Family Medicine

## 2020-10-27 NOTE — Telephone Encounter (Signed)
Left message for patient to call back and schedule Medicare Annual Wellness Visit (AWV) either virtually or in office.   Last AWV no information please schedule at anytime with [facility] Nurse Health Advisor 1 or 2   This should be a 45 minute visit. 

## 2020-11-19 DIAGNOSIS — L820 Inflamed seborrheic keratosis: Secondary | ICD-10-CM | POA: Diagnosis not present

## 2020-12-19 ENCOUNTER — Other Ambulatory Visit: Payer: Self-pay

## 2020-12-22 ENCOUNTER — Ambulatory Visit (INDEPENDENT_AMBULATORY_CARE_PROVIDER_SITE_OTHER): Payer: Medicare Other | Admitting: Obstetrics & Gynecology

## 2020-12-22 ENCOUNTER — Ambulatory Visit (INDEPENDENT_AMBULATORY_CARE_PROVIDER_SITE_OTHER): Payer: Medicare Other | Admitting: Family Medicine

## 2020-12-22 ENCOUNTER — Encounter: Payer: Self-pay | Admitting: Family Medicine

## 2020-12-22 ENCOUNTER — Other Ambulatory Visit: Payer: Self-pay

## 2020-12-22 ENCOUNTER — Other Ambulatory Visit (HOSPITAL_COMMUNITY)
Admission: RE | Admit: 2020-12-22 | Discharge: 2020-12-22 | Disposition: A | Discharge status: Home / Self Care | Payer: Medicare Other | Source: Ambulatory Visit | Attending: Obstetrics & Gynecology | Admitting: Obstetrics & Gynecology

## 2020-12-22 ENCOUNTER — Encounter (HOSPITAL_BASED_OUTPATIENT_CLINIC_OR_DEPARTMENT_OTHER): Payer: Self-pay | Admitting: Obstetrics & Gynecology

## 2020-12-22 VITALS — BP 140/100 | HR 49 | Temp 98.2°F | Ht 63.0 in | Wt 144.9 lb

## 2020-12-22 VITALS — BP 125/74 | HR 56 | Ht 62.0 in | Wt 144.0 lb

## 2020-12-22 DIAGNOSIS — Z01419 Encounter for gynecological examination (general) (routine) without abnormal findings: Secondary | ICD-10-CM | POA: Diagnosis not present

## 2020-12-22 DIAGNOSIS — M25512 Pain in left shoulder: Secondary | ICD-10-CM | POA: Diagnosis not present

## 2020-12-22 DIAGNOSIS — M810 Age-related osteoporosis without current pathological fracture: Secondary | ICD-10-CM

## 2020-12-22 DIAGNOSIS — I10 Essential (primary) hypertension: Secondary | ICD-10-CM

## 2020-12-22 DIAGNOSIS — G8929 Other chronic pain: Secondary | ICD-10-CM

## 2020-12-22 DIAGNOSIS — Z124 Encounter for screening for malignant neoplasm of cervix: Secondary | ICD-10-CM | POA: Diagnosis not present

## 2020-12-22 DIAGNOSIS — E785 Hyperlipidemia, unspecified: Secondary | ICD-10-CM

## 2020-12-22 DIAGNOSIS — Z780 Asymptomatic menopausal state: Secondary | ICD-10-CM

## 2020-12-22 DIAGNOSIS — E210 Primary hyperparathyroidism: Secondary | ICD-10-CM

## 2020-12-22 MED ORDER — AMLODIPINE BESYLATE 2.5 MG PO TABS: 2.5000 mg | ORAL_TABLET | Freq: Every day | ORAL | 3 refills | Status: DC

## 2020-12-22 MED ORDER — MELOXICAM 7.5 MG PO TABS: 7.5000 mg | ORAL_TABLET | Freq: Every day | ORAL | 0 refills | Status: DC | PRN

## 2020-12-22 NOTE — Progress Notes (Signed)
Bonnie Moon DOB: Dec 27, 1950 Encounter date: 12/22/2020  This is a 70 y.o. female who presents with Chief Complaint  Patient presents with  . Follow-up    History of present illness: Last visit was 05/2020  Hip replacement - 04/02/20. Took her longer than she thought to lose hip limp. Back to regular walking.   Left shoulder is bothering her. Had frozen shoulder in past, but it went away. Washing hair bothers her. On and off. Has had issues for at least 3-4 years. Has not had cortisone shot before in shoulder, but saw how well mother did with this and wondered if it would help. Sometimes feels weak in shoulder. Hurts into armpit, side. Bothers her if she rolls over shoulder at night. No numbness or tingling in hand.  HTN: has been checking at home - states that it is just high when she is at doc office. We added hctz to losartan at last visit. bp running in the 130-140's (better for her); diastolic pressure has been ok. Always feels it jump in here. Rare for her to get in the 120's. She feels that it elevates as soon as she checks it.   HL: pravastatin 20 mg daily  Breathing has been better. Happy with oxygen at 99% today. Sometimes gets a little out of breath with start of walk; but gets better as she walks.   Sees endocrinology for bone density. She will get all bloodwork tomorrow with them - she will get this all sent to me. Has bone density and mammogram in May.   Due for colonoscopy this year - she will call back in may for this.    Allergies  Allergen Reactions  . Lipitor [Atorvastatin Calcium]    Current Meds  Medication Sig  . ALPRAZolam (XANAX) 0.25 MG tablet 1 tab orally as needed for anxiety not to exceed more than two a day  . Ascorbic Acid (VITAMIN C PO) Take by mouth.  . cetirizine (ZYRTEC ALLERGY) 10 MG tablet Take 1 tablet (10 mg total) by mouth daily.  . Cholecalciferol (VITAMIN D-3) 1000 UNITS CAPS Take 5,000 Units by mouth.   . losartan-hydrochlorothiazide  (HYZAAR) 100-12.5 MG tablet Take 1 tablet by mouth daily.  . Multiple Vitamins-Minerals (MULTIVITAMIN PO) Take by mouth. gummies 2 daily  . pravastatin (PRAVACHOL) 20 MG tablet Take 1 tablet (20 mg total) by mouth daily.    Review of Systems  Objective:  BP (!) 140/100 (BP Location: Left Arm, Patient Position: Sitting, Cuff Size: Normal)   Pulse (!) 49   Temp 98.2 F (36.8 C) (Oral)   Ht 5\' 3"  (1.6 m)   Wt 144 lb 14.4 oz (65.7 kg)   LMP 07/28/2002   SpO2 99%   BMI 25.67 kg/m   Weight: 144 lb 14.4 oz (65.7 kg)   BP Readings from Last 3 Encounters:  12/22/20 (!) 140/100  06/16/20 140/90  12/14/19 (!) 150/90   Wt Readings from Last 3 Encounters:  12/22/20 144 lb 14.4 oz (65.7 kg)  06/16/20 148 lb 9.6 oz (67.4 kg)  12/14/19 149 lb (67.6 kg)    Physical Exam Constitutional:      General: She is not in acute distress.    Appearance: She is well-developed.  HENT:     Head: Normocephalic and atraumatic.     Right Ear: External ear normal.     Left Ear: External ear normal.     Mouth/Throat:     Pharynx: No oropharyngeal exudate.  Eyes:  Conjunctiva/sclera: Conjunctivae normal.     Pupils: Pupils are equal, round, and reactive to light.  Neck:     Thyroid: No thyromegaly.  Cardiovascular:     Rate and Rhythm: Normal rate and regular rhythm.     Heart sounds: Normal heart sounds. No murmur heard. No friction rub. No gallop.   Pulmonary:     Effort: Pulmonary effort is normal.     Breath sounds: Normal breath sounds.  Abdominal:     General: Bowel sounds are normal. There is no distension.     Palpations: Abdomen is soft. There is no mass.     Tenderness: There is no abdominal tenderness. There is no guarding.     Hernia: No hernia is present.  Musculoskeletal:        General: No tenderness or deformity. Normal range of motion.     Cervical back: Normal range of motion and neck supple.     Comments: Range of motion is good bilaterally although slightly decreased  on the left shoulder with abduction.  Good strength on testing, but there is some discomfort with Hawkins testing.  Most of pain is felt in the lateral shoulder.  Lymphadenopathy:     Cervical: No cervical adenopathy.  Skin:    General: Skin is warm and dry.     Findings: No rash.  Neurological:     Mental Status: She is alert and oriented to person, place, and time.     Deep Tendon Reflexes: Reflexes normal.     Reflex Scores:      Tricep reflexes are 2+ on the right side and 2+ on the left side.      Bicep reflexes are 2+ on the right side and 2+ on the left side.      Brachioradialis reflexes are 2+ on the right side and 2+ on the left side.      Patellar reflexes are 2+ on the right side and 2+ on the left side. Psychiatric:        Speech: Speech normal.        Behavior: Behavior normal.        Thought Content: Thought content normal.     Assessment/Plan   1. Primary hypertension Well-controlled with amlodipine 2.5 mg daily, Hyzaar 100-12.5 mg daily.  Continue current medication. - amLODipine (NORVASC) 2.5 MG tablet; Take 1 tablet (2.5 mg total) by mouth daily.  Dispense: 90 tablet; Refill: 3  2. Chronic left shoulder pain I would like for her to try Mobic first.  If she is able to get some improvement in discomfort with this and avoid injection, I think that is best.  However, if not very elevated we would consider doing a joint injection.  Appreciate any rotator cuff pathology on exam, and suspect that her pain is secondary to arthritis. - meloxicam (MOBIC) 7.5 MG tablet; Take 1 tablet (7.5 mg total) by mouth daily as needed for pain.  Dispense: 30 tablet; Refill: 0  3. Age-related osteoporosis without current pathological fracture She does follow with endocrinology, Dr Shawna Orleans  4. Hyperlipidemia, unspecified hyperlipidemia type Continue with pravastatin 20 mg daily.  Return in about 3 months (around 03/24/2021) for Chronic condition visit.    Micheline Rough, MD

## 2020-12-22 NOTE — Patient Instructions (Addendum)
Update me with blood pressures in 2-3 weeks - so we can adjust. This will be good timing as well for me to make sure I got your lab results.   Tart cherry - capsule form for anti-inflammatory.  Turmeric, glucosamine, fish oil, collagen all worth considering for joint support.   Try the meloxicam (mobic) nightly for 2 weeks - would suggest taking with food to avoid stomach upset. Let me know if shoulder not better after this.

## 2020-12-22 NOTE — Progress Notes (Signed)
70 y.o. G4P3 Widowed White or Caucasian female here for breast and pelvic exam.  Doing well.  Had appt with Dr. Ethlyn Gallery this morning and has appt with Dr. Shawna Orleans in [place] next week with lab appt tomorrow.    Denies vaginal bleeding.  Has h/o osteoporosis and has declined treatment in the past.  She does have BMD scheduled.  Her endocrinologist has been encouraging this.  Pt wants to know my opinion.  We discussed some of the risks that she is specifically concerned about.  She does need repeat BMD at this time.  Order for this to [facility] has already been completed.  She will likely want to come in an talk after this.  Reports her endocrinologist has very strongly recommended treatment.  Pt has h/o parathyroid adenoma with h/o elevated PTH, now s/p resection.  As per above, has blood work tomorrow and then appt in one week.   Pt needed some dental work last year for tooth removal and possibly implant.  Ultimately decided against the implant but oral surgeon also cautioned her about bone risks with any medication for osteoporosis.  This has her worried as well.  Patient's last menstrual period was 07/28/2002.          Sexually active: No.  H/O STD:  no  Health Maintenance: PCP:  Dr Ethlyn Gallery.  Last wellness appt was today.  Did blood work at that appt:  Today and tomorrow with endocrinologist Vaccines are up to date:  yes Colonoscopy:  2015, will have this done this year.   MMG:  04/29/2020 BMD:  11/202019 Last pap smear:  12/2016.   H/o abnormal pap smear:  no    reports that she has never smoked. She has never used smokeless tobacco. She reports current alcohol use of about 2.0 standard drinks of alcohol per week. She reports that she does not use drugs.  Past Medical History:  Diagnosis Date  . Allergy   . GERD (gastroesophageal reflux disease)   . Hyperlipidemia   . IBS (irritable bowel syndrome)   . Nephrolithiasis   . Osteopenia   . Thyroid nodule   . Tubular adenoma of colon 02/2009  .  Vitamin D insufficiency     Past Surgical History:  Procedure Laterality Date  . BREAST ENHANCEMENT SURGERY    . PARATHYROIDECTOMY  01/17/2017   done by Dr. Oran Rein- at [facility] in [place]  (1/2 parathyroid remains)  . TONSILLECTOMY    . TOTAL HIP ARTHROPLASTY Left 05/2008  . TOTAL HIP ARTHROPLASTY Right 04/02/2020   Dr Reynolds Bowl patient    Current Outpatient Medications  Medication Sig Dispense Refill  . ALPRAZolam (XANAX) 0.25 MG tablet 1 tab orally as needed for anxiety not to exceed more than two a day 30 tablet 0  . amLODipine (NORVASC) 2.5 MG tablet Take 1 tablet (2.5 mg total) by mouth daily. 90 tablet 3  . Ascorbic Acid (VITAMIN C PO) Take by mouth.    . cetirizine (ZYRTEC ALLERGY) 10 MG tablet Take 1 tablet (10 mg total) by mouth daily. 90 tablet 3  . Cholecalciferol (VITAMIN D-3) 1000 UNITS CAPS Take 5,000 Units by mouth.     . losartan-hydrochlorothiazide (HYZAAR) 100-12.5 MG tablet Take 1 tablet by mouth daily. 90 tablet 3  . meloxicam (MOBIC) 7.5 MG tablet Take 1 tablet (7.5 mg total) by mouth daily as needed for pain. 30 tablet 0  . Multiple Vitamins-Minerals (MULTIVITAMIN PO) Take by mouth. gummies 2 daily    . pravastatin (PRAVACHOL) 20 MG  tablet Take 1 tablet (20 mg total) by mouth daily. 90 tablet 1   No current facility-administered medications for this visit.    Family History  Problem Relation Age of Onset  . Atrial fibrillation Father   . Alzheimer's disease Father   . Other Father        pacemaker  . Osteopenia Mother   . Heart disease Mother        a. fib  . High Cholesterol Mother   . Heart failure Mother        pacemaker  . Other Mother        parathyroid disease  . Thyroid disease Daughter        nodule, cancer  . High Cholesterol Daughter   . Other Sister        parathyroid  . Thyroid nodules Brother   . Colon cancer Neg Hx   . Pancreatic cancer Neg Hx   . Rectal cancer Neg Hx   . Stomach cancer Neg Hx     Review of Systems  All other  systems reviewed and are negative.   Exam:   BP 125/74   Pulse (!) 56   Ht 5\' 2"  (1.575 m)   Wt 144 lb (65.3 kg)   LMP 07/28/2002   BMI 26.34 kg/m   Height: 5\' 2"  (157.5 cm)  General appearance: alert, cooperative and appears stated age Breasts: normal appearance, no masses or tenderness Abdomen: soft, non-tender; bowel sounds normal; no masses,  no organomegaly Lymph nodes: Cervical, supraclavicular, and axillary nodes normal.  No abnormal inguinal nodes palpated Neurologic: Grossly normal  Pelvic: External genitalia:  no lesions              Urethra:  normal appearing urethra with no masses, tenderness or lesions              Bartholins and Skenes: normal                 Vagina: normal appearing vagina with normal color and discharge, no lesions              Cervix: no lesions              Pap taken: Yes.   Bimanual Exam:  Uterus:  normal size, contour, position, consistency, mobility, non-tender              Adnexa: normal adnexa and no mass, fullness, tenderness               Rectovaginal: Confirms               Anus:  normal sphincter tone, no lesions  Chaperone, Prince Rome, CMA, was present for exam.  Assessment/Plan: 1. Encntr for gyn exam (general) (routine) w/o abn findings - pap smear obtained today - has MMG/BMD scheduled with [facility] - will plan colonoscopy with Dr. Fuller Plan this year - lab work done with PCP and endocrinologist - vaccine up to date  2. Cervical cancer screening - Cytology - PAP( [facility]) - PR OBTAINING SCREEN PAP SMEAR  3. Postmenopausal - no HRT  4. Age-related osteoporosis without current pathological fracture - BMD is scheduled with [facility].  Will likely plan consult after appt.  5. Hyperparathyroidism, primary ([place]) - has follow up scheduled with Dr. Shawna Orleans in [place] - s/p of resection of 3 1/2 parathyroid glands  Total time with pt: 22 minutes

## 2020-12-23 DIAGNOSIS — R739 Hyperglycemia, unspecified: Secondary | ICD-10-CM | POA: Diagnosis not present

## 2020-12-23 DIAGNOSIS — E785 Hyperlipidemia, unspecified: Secondary | ICD-10-CM | POA: Diagnosis not present

## 2020-12-23 DIAGNOSIS — E559 Vitamin D deficiency, unspecified: Secondary | ICD-10-CM | POA: Diagnosis not present

## 2020-12-23 DIAGNOSIS — E042 Nontoxic multinodular goiter: Secondary | ICD-10-CM | POA: Diagnosis not present

## 2020-12-24 LAB — CYTOLOGY - PAP: Diagnosis: NEGATIVE

## 2020-12-25 ENCOUNTER — Telehealth: Payer: Self-pay

## 2020-12-25 NOTE — Telephone Encounter (Signed)
Attempt made to contact Bonnie Moon is a 70 y.o. female re: message from Dr. Sabra Heck below.  Pt was not available.  LM on the VM for the patient to call back.

## 2020-12-25 NOTE — Telephone Encounter (Signed)
-----   Message from [name], MD sent at 12/25/2020  6:04 AM EDT ----- Pt isn't on Mychart.  Can you let her know her pap smear was normal?  Thanks.

## 2020-12-30 DIAGNOSIS — M810 Age-related osteoporosis without current pathological fracture: Secondary | ICD-10-CM | POA: Diagnosis not present

## 2020-12-30 DIAGNOSIS — R739 Hyperglycemia, unspecified: Secondary | ICD-10-CM | POA: Diagnosis not present

## 2020-12-30 DIAGNOSIS — I10 Essential (primary) hypertension: Secondary | ICD-10-CM | POA: Diagnosis not present

## 2020-12-30 DIAGNOSIS — E042 Nontoxic multinodular goiter: Secondary | ICD-10-CM | POA: Diagnosis not present

## 2020-12-30 DIAGNOSIS — E210 Primary hyperparathyroidism: Secondary | ICD-10-CM | POA: Diagnosis not present

## 2020-12-30 DIAGNOSIS — E785 Hyperlipidemia, unspecified: Secondary | ICD-10-CM | POA: Diagnosis not present

## 2020-12-30 DIAGNOSIS — E559 Vitamin D deficiency, unspecified: Secondary | ICD-10-CM | POA: Diagnosis not present

## 2021-02-04 ENCOUNTER — Encounter: Payer: Self-pay | Admitting: Family Medicine

## 2021-02-04 DIAGNOSIS — M810 Age-related osteoporosis without current pathological fracture: Secondary | ICD-10-CM | POA: Diagnosis not present

## 2021-02-04 DIAGNOSIS — Z1231 Encounter for screening mammogram for malignant neoplasm of breast: Secondary | ICD-10-CM | POA: Diagnosis not present

## 2021-02-11 IMAGING — DX CERVICAL SPINE - COMPLETE 4+ VIEW
5 series · 5 of 5 positions shown · non-contrast
Comparison: None.

CLINICAL DATA: Neck pain, left shoulder pain

EXAM:
CERVICAL SPINE - COMPLETE 4+ VIEW

[cervical spine lat]
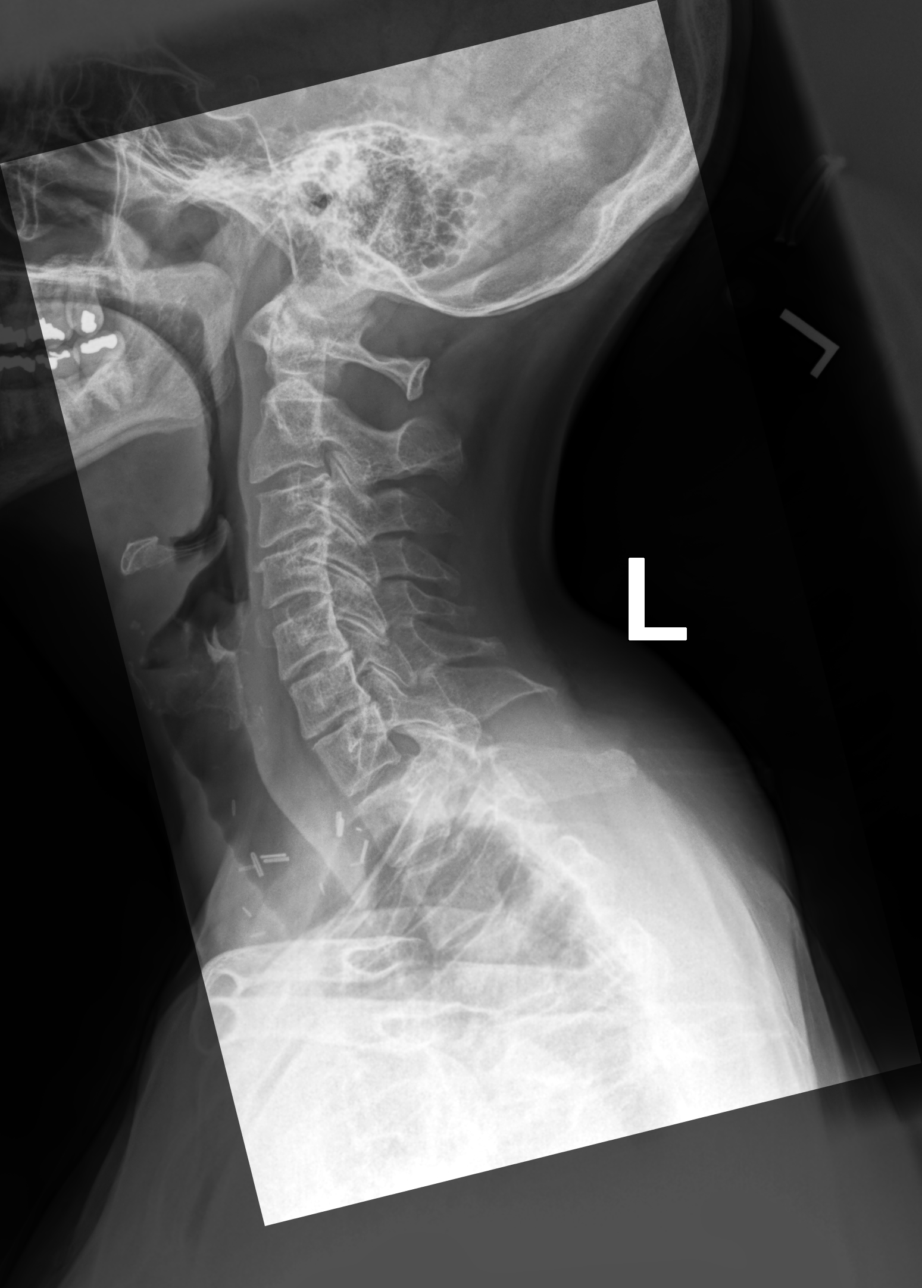

[cervical spine oblique (1 of 2)]
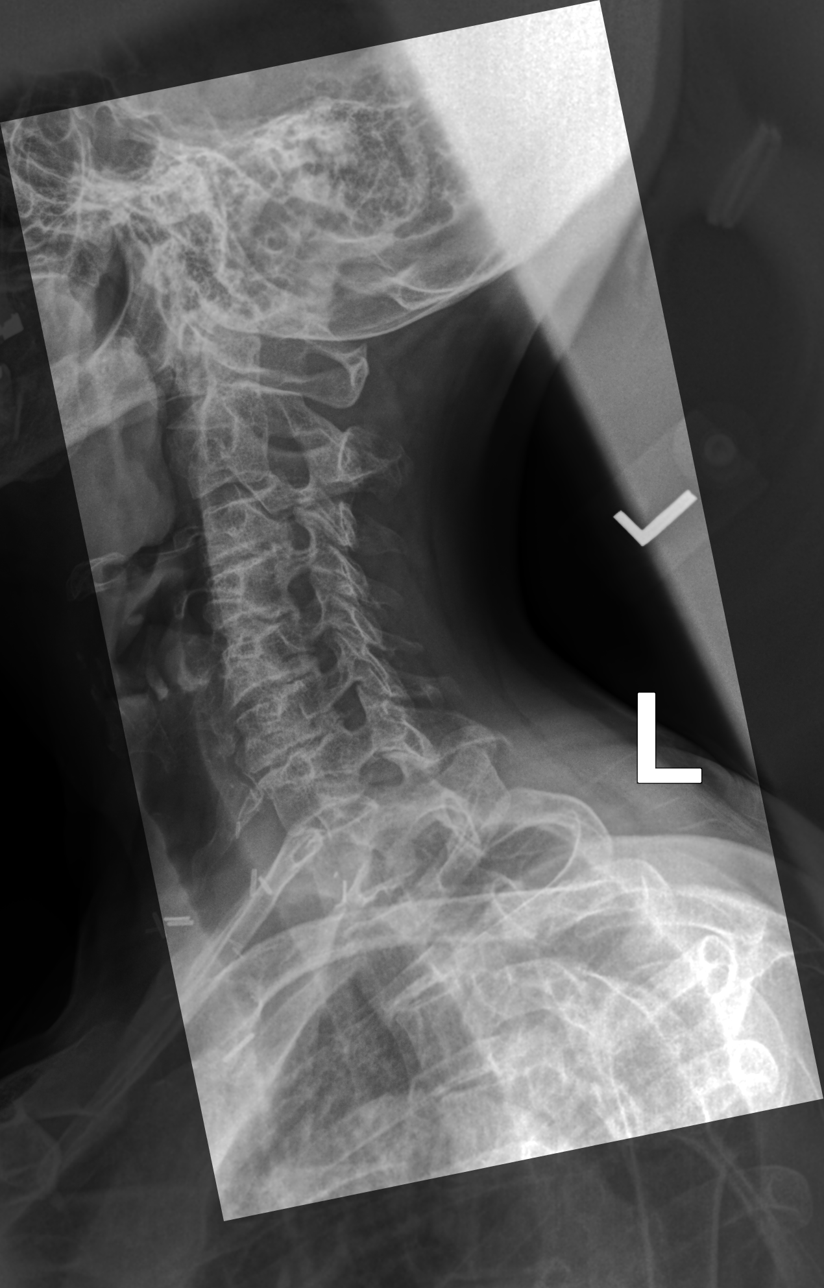

[cervical spine oblique (2 of 2)]
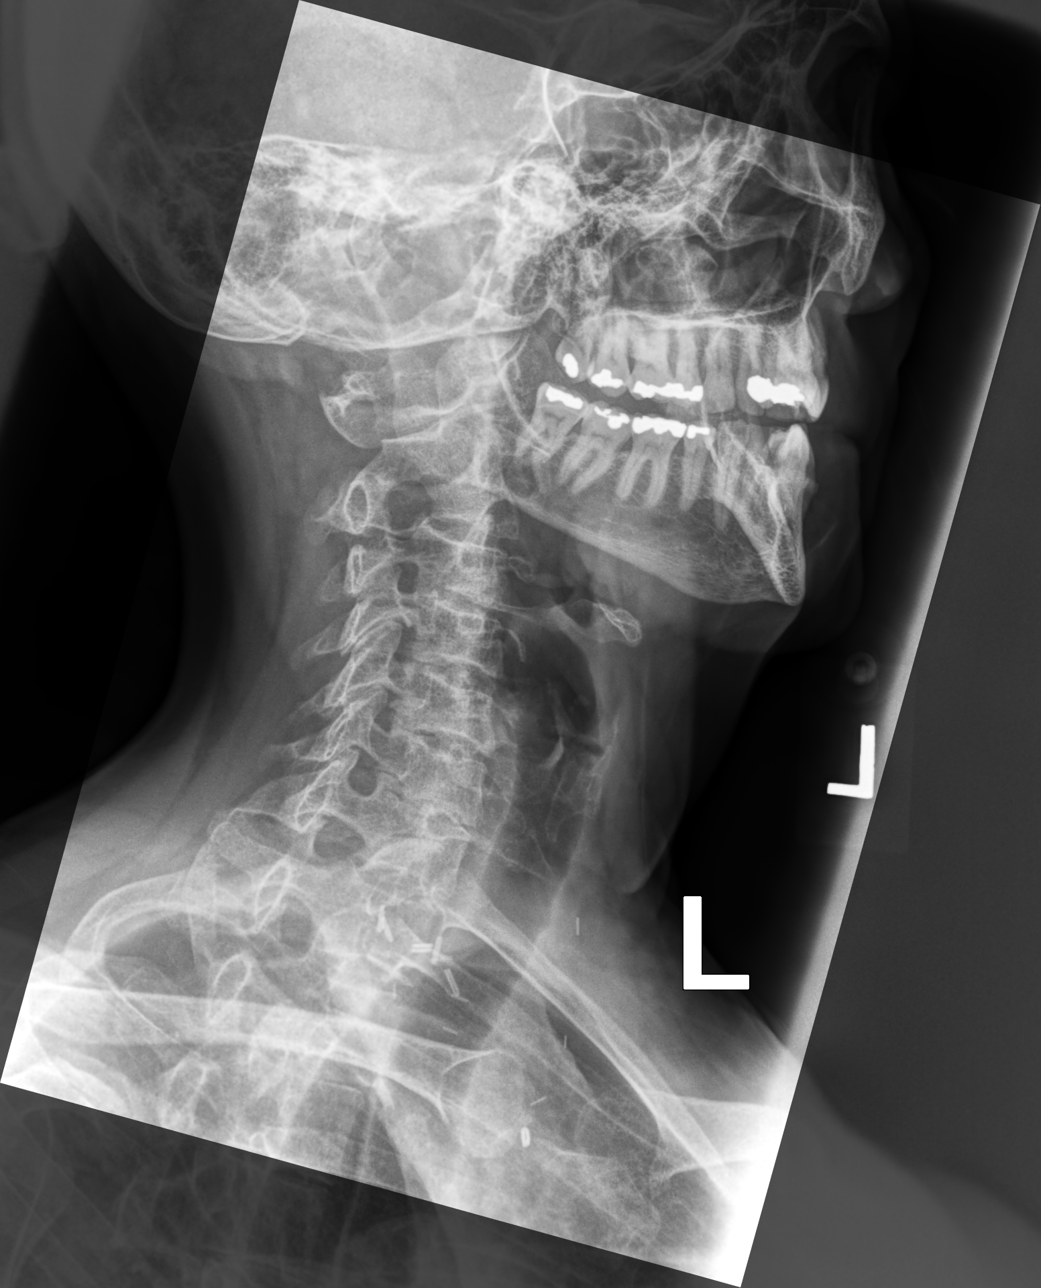

[cervical spine ap]
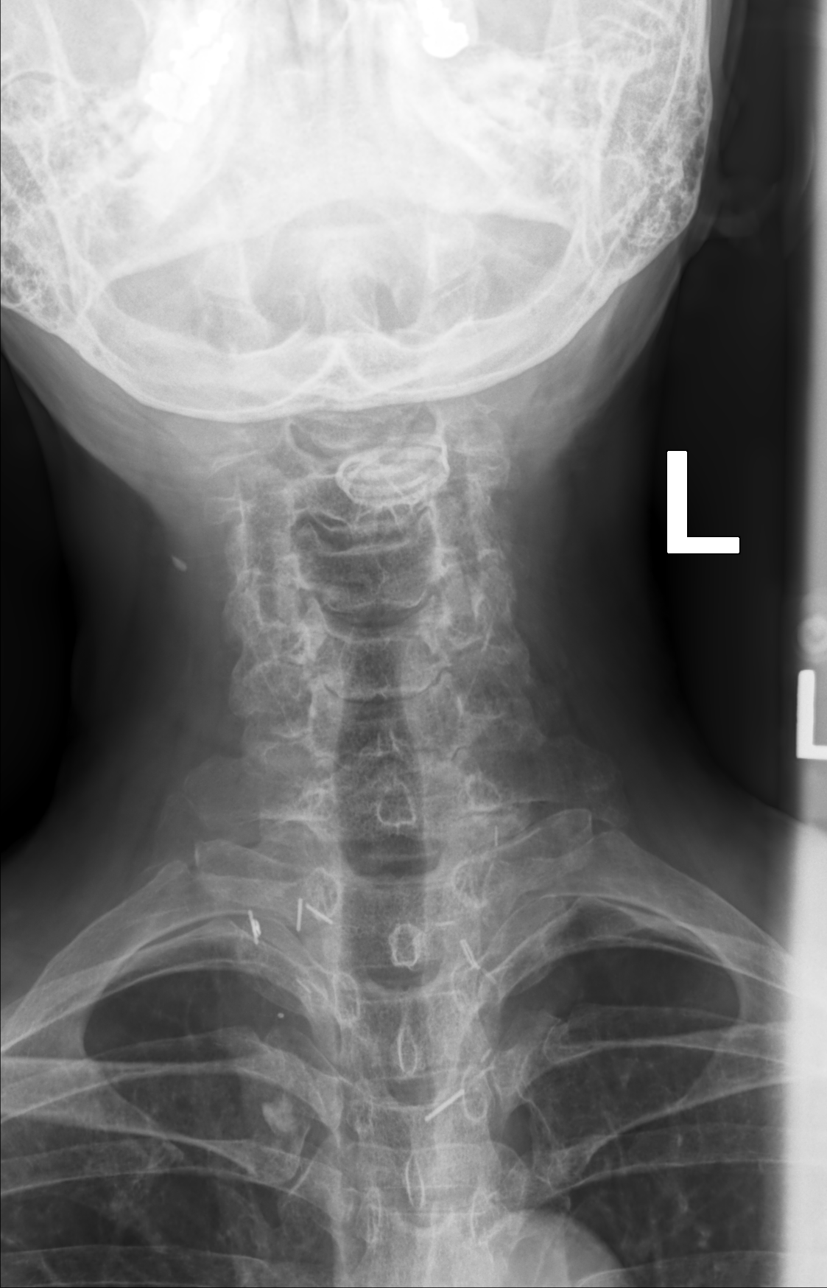

[cervical spine open mouth ap]
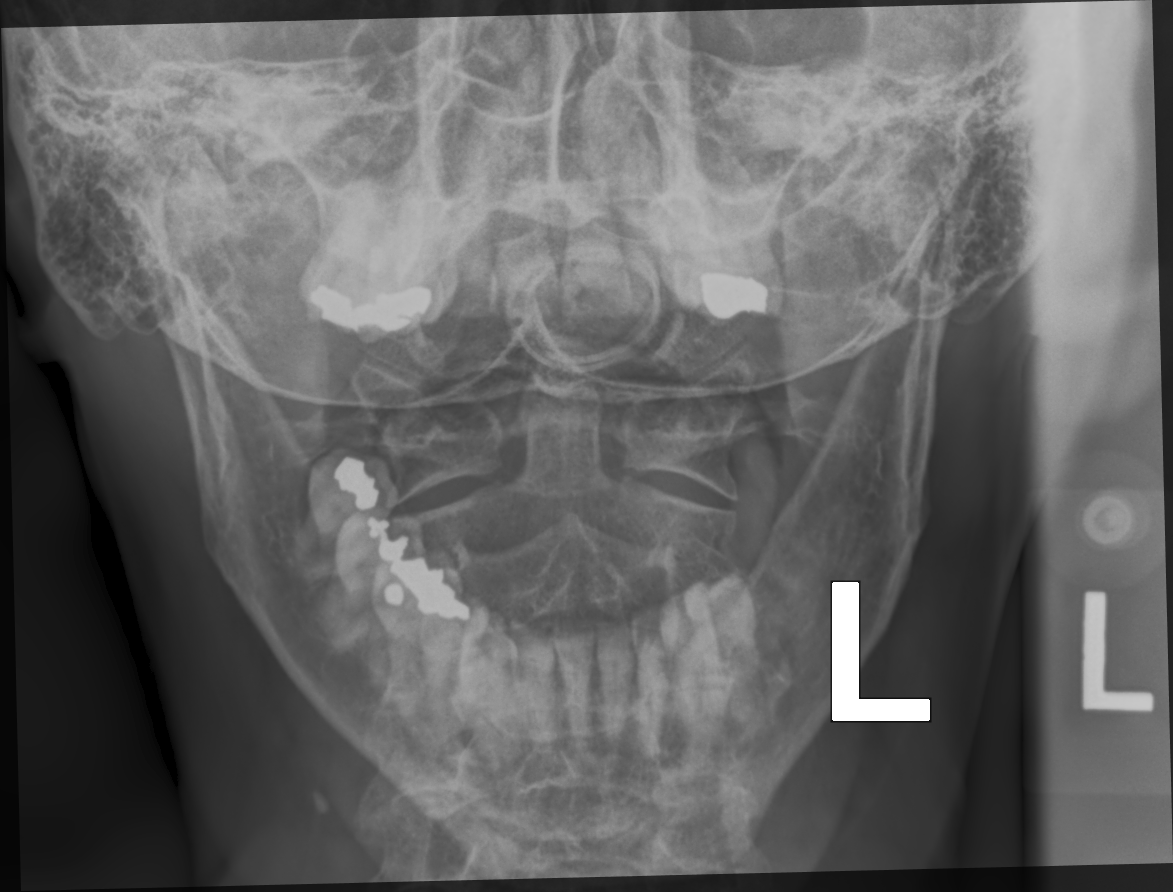

[5 of 5 positions shown; findings below may reference images not displayed]

FINDINGS: Diffuse degenerative disc and facet disease. Moderate to severe
right neural foraminal narrowing from C3-4 through C5-6. Mild left
neural foraminal narrowing at C3-4 and C5-6. Normal alignment. No
fracture. Prevertebral soft tissues are normal.
IMPRESSION: Degenerative disc and facet disease with bilateral neural foraminal
narrowing, right greater than left as above. No acute bony
abnormality.

## 2021-02-16 ENCOUNTER — Telehealth (HOSPITAL_BASED_OUTPATIENT_CLINIC_OR_DEPARTMENT_OTHER): Payer: Self-pay | Admitting: *Deleted

## 2021-02-16 NOTE — Telephone Encounter (Signed)
Pt called stating that she is having symptoms of a UTI. She complains of pelvic pressure, increased frequency, and some burning with urination. She states that she has had UTIs in the past and this feels the same. Advised pt that she could come in and have her urine tested. Pt unable to come today. Placed on schedule for tomorrow. Advised to call with any other concerns between now and then.

## 2021-02-17 ENCOUNTER — Other Ambulatory Visit: Payer: Self-pay

## 2021-02-17 ENCOUNTER — Telehealth: Payer: Self-pay | Admitting: Family Medicine

## 2021-02-17 ENCOUNTER — Other Ambulatory Visit (HOSPITAL_BASED_OUTPATIENT_CLINIC_OR_DEPARTMENT_OTHER)
Admission: RE | Admit: 2021-02-17 | Discharge: 2021-02-17 | Disposition: A | Discharge status: Home / Self Care | Payer: Medicare Other | Source: Ambulatory Visit | Attending: Obstetrics & Gynecology | Admitting: Obstetrics & Gynecology

## 2021-02-17 ENCOUNTER — Ambulatory Visit (INDEPENDENT_AMBULATORY_CARE_PROVIDER_SITE_OTHER): Payer: Medicare Other | Admitting: *Deleted

## 2021-02-17 DIAGNOSIS — R350 Frequency of micturition: Secondary | ICD-10-CM | POA: Insufficient documentation

## 2021-02-17 LAB — POCT URINALYSIS DIPSTICK
Appearance: NORMAL
Bilirubin, UA: NEGATIVE
Blood, UA: NEGATIVE
Glucose, UA: NEGATIVE
Ketones, UA: NEGATIVE
Nitrite, UA: NEGATIVE
Protein, UA: NEGATIVE
Spec Grav, UA: 1.02 (ref 1.010–1.025)
Urobilinogen, UA: 2 E.U./dL — AB
pH, UA: 7 (ref 5.0–8.0)

## 2021-02-17 MED ORDER — SULFAMETHOXAZOLE-TRIMETHOPRIM 800-160 MG PO TABS: 1.0000 | ORAL_TABLET | Freq: Two times a day (BID) | ORAL | 0 refills | Status: AC

## 2021-02-17 NOTE — Progress Notes (Signed)
Pt c/o increased urinary frequency, burning with urination, and lower back pain.  Urine obtained and sent for culture. Advised that antibiotics would be sent to pharmacy for symptoms and to let us know if there was no improvement after completion. Pt verbalized understanding.

## 2021-02-17 NOTE — Addendum Note (Signed)
Addended by: Blenda Nicely on: 02/17/2021 10:27 AM   Modules accepted: Orders

## 2021-02-17 NOTE — Chronic Care Management (AMB) (Signed)
  Chronic Care Management   Note  02/17/2021 Name: Bonnie Moon MRN: [id number] DOB: May 03, 1951  Bonnie Moon is a 70 y.o. year old female who is a primary care patient of Koberlein, Steele Berg, MD. I reached out to [name] by phone today in response to a referral sent by [name] PCP, Caren Macadam, MD.   [name] was given information about Chronic Care Management services today including:  1. CCM service includes personalized support from designated clinical staff supervised by her physician, including individualized plan of care and coordination with other care providers 2. 24/7 contact phone numbers for assistance for urgent and routine care needs. 3. Service will only be billed when office clinical staff spend 20 minutes or more in a month to coordinate care. 4. Only one practitioner may furnish and bill the service in a calendar month. 5. The patient may stop CCM services at any time (effective at the end of the month) by phone call to the office staff.   Patient agreed to services and verbal consent obtained.   Follow up plan:   Tatjana Secretary/administrator

## 2021-02-18 LAB — URINE CULTURE: Culture: NO GROWTH

## 2021-02-26 ENCOUNTER — Encounter (HOSPITAL_BASED_OUTPATIENT_CLINIC_OR_DEPARTMENT_OTHER): Payer: Self-pay | Admitting: Obstetrics & Gynecology

## 2021-03-05 ENCOUNTER — Encounter: Payer: Self-pay | Admitting: Gastroenterology

## 2021-03-05 ENCOUNTER — Other Ambulatory Visit: Payer: Self-pay | Admitting: Family Medicine

## 2021-03-05 DIAGNOSIS — E785 Hyperlipidemia, unspecified: Secondary | ICD-10-CM

## 2021-03-16 ENCOUNTER — Ambulatory Visit: Payer: Medicare Other | Admitting: Family Medicine

## 2021-03-20 ENCOUNTER — Encounter (HOSPITAL_BASED_OUTPATIENT_CLINIC_OR_DEPARTMENT_OTHER): Payer: Self-pay | Admitting: Obstetrics & Gynecology

## 2021-03-26 ENCOUNTER — Telehealth: Payer: Self-pay | Admitting: Family Medicine

## 2021-03-26 NOTE — Telephone Encounter (Signed)
Left message for patient to call back and schedule Medicare Annual Wellness Visit (AWV) either virtually or in office.  AWV-I PER [place] 09/27/18  please schedule at anytime with [facility] Nurse Health Advisor 1 or 2   This should be a 45 minute visit.

## 2021-04-02 ENCOUNTER — Telehealth: Payer: Self-pay | Admitting: Pharmacist

## 2021-04-02 NOTE — Chronic Care Management (AMB) (Signed)
    Chronic Care Management Pharmacy Assistant   Name: Bonnie Moon  MRN: [id number] DOB: January 18, 1951   Reason for Encounter: Chart review for CPP on 04-07-2021 at 11:00 am   Conditions to be addressed/monitored: HTN, HLD, Anxiety, and Vitamin D deficiency   Recent office visits:  12-22-2020 Caren Macadam MD (PCP) - Patient presented for follow up visit for hypertension, hyperlipidemia, osteoporosis and left shoulder pain. Amlodipine 2.5mg  added, Meloxicam 7.5 mg started for PRN use. Patient to return in 3 months.  Recent consult visits:  02-17-2021 Jerrell Belfast. Julien Girt RN ([facility]) - Patient presented for urinary frequency. Sulfamethoxazole- Trimethoprim 800-160mg  was started for 10 days. No follow up date was noted.  12-22-2020 [name] MD ([facility]) - Patient presented for general routine exam. No medication changes noted. PAP performed. No follow-up visit noted.    Hospital visits:  None in previous 6 months  Have you seen any other providers since your last visit? No  Any changes in your medications or health? Yes- Patient stopped taking Amlodipine 2.5 mg due to side effects.   Any side effects from any medications? Yes- Amlodipine 2.5 mg- patient complained of leg pains and ankle swelling, took medication for 1 month then stopped.   Do you have an symptoms or problems not managed by your medications? no Any concerns about your health right now? No  Has your provider asked that you check blood pressure, blood sugar, or follow special diet at home? Yes- Patient does check blood pressures, ranging in the 130's/80's sometimes better, no other readings available at this time.   Do you have any problems getting your medications? No  Is there anything that you would like to discuss during the appointment? Hypertension medications.  Patient to have all medications and supplements and blood pressure log near during telephone  appointment.  Medications: Outpatient Encounter Medications as of 04/02/2021  Medication Sig   ALPRAZolam (XANAX) 0.25 MG tablet 1 tab orally as needed for anxiety not to exceed more than two a day   amLODipine (NORVASC) 2.5 MG tablet Take 1 tablet (2.5 mg total) by mouth daily.   Ascorbic Acid (VITAMIN C PO) Take by mouth.   cetirizine (ZYRTEC ALLERGY) 10 MG tablet Take 1 tablet (10 mg total) by mouth daily.   Cholecalciferol (VITAMIN D-3) 1000 UNITS CAPS Take 5,000 Units by mouth.    losartan-hydrochlorothiazide (HYZAAR) 100-12.5 MG tablet Take 1 tablet by mouth daily.   meloxicam (MOBIC) 7.5 MG tablet Take 1 tablet (7.5 mg total) by mouth daily as needed for pain.   Multiple Vitamins-Minerals (MULTIVITAMIN PO) Take by mouth. gummies 2 daily   pravastatin (PRAVACHOL) 20 MG tablet TAKE 1 TABLET BY MOUTH EVERY DAY   No facility-administered encounter medications on file as of 04/02/2021.   Fill History: Amlodipine Besylate last filled 12-22-2020 90[facility] at [facility]- Patient reported no longer taking due to side effects.   Care Gaps: Annual Wellness Visit, Patient was contacted on 03-26-2021 to schedule Colonoscopy, Patient advised to schedule PAP, Done 12-22-2020 Needs: Zoster vaccine & Dose 4 of COVID vaccine  Star Rating Drugs:  Losartan- Hydrochlorothiazide 100-12.5mg  - Last filled on 03-10-2021 90[facility] at [facility] Pravastatin 20mg  - Last filled 03-05-2021 90DS at [facility], [facility] Pharmacist Assistant [phone]

## 2021-04-07 ENCOUNTER — Telehealth: Payer: Medicare Other

## 2021-04-07 NOTE — Progress Notes (Deleted)
Chronic Care Management Pharmacy Note  04/07/2021 Name:  Bonnie Moon MRN:  [id number] DOB:  10-28-1950  Summary: ***  Recommendations/Changes made from today's visit: ***  Plan: ***   Subjective: Bonnie Moon is an 70 y.o. year old female who is a primary patient of Koberlein, Paris Lore, MD.  The CCM team was consulted for assistance with disease management and care coordination needs.    Engaged with patient by telephone for initial visit in response to provider referral for pharmacy case management and/or care coordination services.   Consent to Services:  The patient was given the following information about Chronic Care Management services today, agreed to services, and gave verbal consent: 1. CCM service includes personalized support from designated clinical staff supervised by the primary care provider, including individualized plan of care and coordination with other care providers 2. 24/7 contact phone numbers for assistance for urgent and routine care needs. 3. Service will only be billed when office clinical staff spend 20 minutes or more in a month to coordinate care. 4. Only one practitioner may furnish and bill the service in a calendar month. 5.The patient may stop CCM services at any time (effective at the end of the month) by phone call to the office staff. 6. The patient will be responsible for cost sharing (co-pay) of up to 20% of the service fee (after annual deductible is met). Patient agreed to services and consent obtained.  Patient Care Team: Wynn Banker, MD as PCP - General (Family Medicine) Jerene Bears, MD as Attending Physician (Obstetrics and Gynecology) Meryl Dare, MD as Attending Physician (Gastroenterology) Doristine Locks, MD (Internal Medicine) Doristine Locks, MD (Internal Medicine) Verner Chol, [facility] as Pharmacist (Pharmacist)  Recent office visits: 12/22/20 Wynn Banker MD (PCP) - Patient presented for follow up visit for  hypertension, hyperlipidemia, osteoporosis and left shoulder pain. Amlodipine 2.5mg  added, Meloxicam 7.5 mg started for PRN use. Patient to return in 3 months.  Recent consult visits: 02/17/21 Harlow Asa. Renaldo Fiddler RN ([facility]) - Patient presented for urinary frequency. Sulfamethoxazole- Trimethoprim 800-160mg  was started for 10 days. No follow up date was noted.   12/22/20 Jerene Bears MD ([facility]) - Patient presented for general routine exam. No medication changes noted. PAP performed. No follow-up visit noted.   Hospital visits: None in previous 6 months   Objective:  Lab Results  Component Value Date   CREATININE 0.78 02/28/2019   BUN 15 02/28/2019   GFR 73.80 02/28/2019   NA 140 02/28/2019   K 4.2 02/28/2019   CALCIUM 9.8 02/28/2019   CALCIUM 9.7 02/28/2019   CO2 29 02/28/2019   GLUCOSE 83 02/28/2019    Lab Results  Component Value Date/Time   HGBA1C 5.4 01/05/2013 10:10 AM   GFR 73.80 02/28/2019 09:49 AM   GFR 85.01 01/05/2013 10:10 AM    Last diabetic Eye exam: No results found for: HMDIABEYEEXA  Last diabetic Foot exam: No results found for: HMDIABFOOTEX   Lab Results  Component Value Date   CHOL 269 (H) 02/28/2019   HDL 79.70 02/28/2019   LDLCALC 173 (H) 02/28/2019   LDLDIRECT 179.1 01/05/2013   TRIG 81.0 02/28/2019   CHOLHDL 3 02/28/2019    Hepatic Function Latest Ref Rng & Units 02/28/2019 12/08/2015 10/08/2014  Total Protein 6.0 - 8.3 g/dL 7.1 7.0 6.8  Albumin 3.5 - 5.2 g/dL 4.4 4.2 4.0  AST 0 - 37 U/L 17 19 20   ALT 0 - 35 U/L 10  12 14  Alk Phosphatase 39 - 117 U/L 95 129 121(H)  Total Bilirubin 0.2 - 1.2 mg/dL 0.9 0.7 0.5    Lab Results  Component Value Date/Time   TSH 0.42 02/28/2019 09:49 AM   TSH 0.43 12/08/2015 09:18 AM   FREET4 1.08 02/28/2019 09:49 AM   FREET4 0.81 01/05/2013 10:10 AM    CBC Latest Ref Rng & Units 02/28/2019 12/08/2015 10/08/2014  WBC 4.0 - 10.5 K/uL 5.3 5.6 6.1  Hemoglobin 12.0 - 15.0 g/dL 14.2 13.8 13.7   Hematocrit 36.0 - 46.0 % 41.5 41.4 41.5  Platelets 150.0 - 400.0 K/uL 273.0 267 255    Lab Results  Component Value Date/Time   VD25OH 65.63 02/28/2019 09:49 AM   VD25OH 30 12/08/2015 09:18 AM   VD25OH 30 10/08/2014 08:57 AM    Clinical ASCVD: {YES/NO:21197} The 10-year ASCVD risk score Mikey Bussing DC Jr., et al., 2013) is: 9.9%   Values used to calculate the score:     Age: 82 years     Sex: Female     Is Non-Hispanic African American: No     Diabetic: No     Tobacco smoker: No     Systolic Blood Pressure: 500 mmHg     Is BP treated: Yes     HDL Cholesterol: 79.7 mg/dL     Total Cholesterol: 269 mg/dL    Depression screen [facility] 2/9 06/16/2020 08/15/2018 01/05/2013  Decreased Interest 0 - 0  Down, Depressed, Hopeless 0 0 0  PHQ - 2 Score 0 0 0  Altered sleeping - 1 -  Tired, decreased energy - 1 -  Change in appetite - 0 -  Feeling bad or failure about yourself  - 0 -  Trouble concentrating - 0 -  Moving slowly or fidgety/restless - 1 -  Suicidal thoughts - 0 -  PHQ-9 Score - 3 -     ***Other: (CHADS2VASc if Afib, MMRC or CAT for COPD, ACT, DEXA)  Social History   Tobacco Use  Smoking Status Never  Smokeless Tobacco Never   BP Readings from Last 3 Encounters:  12/22/20 125/74  12/22/20 (!) 140/100  06/16/20 140/90   Pulse Readings from Last 3 Encounters:  12/22/20 (!) 56  12/22/20 (!) 49  06/16/20 (!) 52   Wt Readings from Last 3 Encounters:  12/22/20 144 lb (65.3 kg)  12/22/20 144 lb 14.4 oz (65.7 kg)  06/16/20 148 lb 9.6 oz (67.4 kg)   BMI Readings from Last 3 Encounters:  12/22/20 26.34 kg/m  12/22/20 25.67 kg/m  06/16/20 26.32 kg/m    Assessment/Interventions: Review of patient past medical history, allergies, medications, health status, including review of consultants reports, laboratory and other test data, was performed as part of comprehensive evaluation and provision of chronic care management services.   SDOH:  (Social Determinants of Health)  assessments and interventions performed: {yes/no:20286}  SDOH Screenings   Alcohol Screen: Not on file  Depression (PHQ2-9): Low Risk    PHQ-2 Score: 0  Financial Resource Strain: Not on file  Food Insecurity: Not on file  Housing: Not on file  Physical Activity: Not on file  Social Connections: Not on file  Stress: Not on file  Tobacco Use: Low Risk    Smoking Tobacco Use: Never   Smokeless Tobacco Use: Never  Transportation Needs: Not on file    CCM Care Plan  Allergies  Allergen Reactions   Lipitor [Atorvastatin Calcium]     Medications Reviewed Today     Reviewed by  Elita Quick, CMA (Certified Medical Assistant) on 12/22/20 at 1433  Med List Status: <None>   Medication Order Taking? Sig Documenting Provider Last Dose Status Informant  ALPRAZolam (XANAX) 0.25 MG tablet [id number] Yes 1 tab orally as needed for anxiety not to exceed more than two a day Koberlein, Junell C, MD Taking Active   amLODipine (NORVASC) 2.5 MG tablet [id number] Yes Take 1 tablet (2.5 mg total) by mouth daily. Caren Macadam, MD Taking Active   Ascorbic Acid (VITAMIN C PO) [id number] Yes Take by mouth. [provider] Taking Active   cetirizine (ZYRTEC ALLERGY) 10 MG tablet [id number] Yes Take 1 tablet (10 mg total) by mouth daily. Caren Macadam, MD Taking Active   Cholecalciferol (VITAMIN D-3) 1000 UNITS CAPS [id number] Yes Take 5,000 Units by mouth.  [provider] Taking Active   losartan-hydrochlorothiazide (HYZAAR) 100-12.5 MG tablet [id number] Yes Take 1 tablet by mouth daily. Caren Macadam, MD Taking Active   meloxicam (MOBIC) 7.5 MG tablet [id number] Yes Take 1 tablet (7.5 mg total) by mouth daily as needed for pain. Caren Macadam, MD Taking Active   Multiple Vitamins-Minerals (MULTIVITAMIN PO) [id number] Yes Take by mouth. gummies 2 daily [provider] Taking Active   pravastatin (PRAVACHOL) 20 MG tablet [id number] Yes Take 1 tablet (20 mg total)  by mouth daily. 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   Hyperparathyroidism, primary ([place]) 11/16/2016   Vitamin D deficiency 01/26/2013   Allergic rhinitis 01/26/2013   Hyperlipidemia 01/26/2013   S/P hip replacement 01/17/2012    Immunization History  Administered Date(s) Administered   Influenza-Unspecified 08/28/2019, 09/13/2020   Moderna SARS-COV2 Booster Vaccination 08/01/2020   Moderna Sars-Covid-2 Vaccination 10/17/2019, 11/14/2019   Pneumococcal Polysaccharide-23 07/09/2019   Td 09/27/2017    Conditions to be addressed/monitored:  Hypertension, Hyperlipidemia, Anxiety, Osteoporosis, Allergic Rhinitis, and Hyperparathryoidism  There are no care plans that you recently modified to display for this patient.  Current Barriers:  {pharmacybarriers:24917}  Pharmacist Clinical Goal(s):  Patient will {PHARMACYGOALCHOICES:24921} through collaboration with PharmD and provider.   Interventions: 1:1 collaboration with Caren Macadam, MD regarding development and update of comprehensive plan of care as evidenced by provider attestation and co-signature Inter-disciplinary care team collaboration (see longitudinal plan of care) Comprehensive medication review performed; medication list updated in electronic medical record  BP Readings from Last 3 Encounters:  12/22/20 125/74  12/22/20 (!) 140/100  06/16/20 140/90    Hypertension (BP goal <140/90) -Uncontrolled -Current treatment: Losartan-HCTZ 100-12.5 mg 1 tablet daily -Medications previously tried: amlodipine (ankle swelling) -Current home readings: *** -Current dietary habits: *** -Current exercise habits: *** -{ACTIONS;DENIES/REPORTS:21021675}  hypotensive/hypertensive symptoms -Educated on {CCM BP Counseling:25124} -Counseled to monitor BP at home ***, document, and provide log at future appointments -{CCMPHARMDINTERVENTION:25122} -increase?  Lab Results  Component Value Date   CHOL 269 (H) 02/28/2019   HDL 79.70 02/28/2019   LDLCALC 173 (H) 02/28/2019   LDLDIRECT 179.1 01/05/2013   TRIG 81.0 02/28/2019   CHOLHDL 3 02/28/2019    Hyperlipidemia: (LDL goal < 100) -Uncontrolled -Current treatment: Pravastatin 20 mg 1 tablet every day -Medications previously tried: ***  -Current dietary patterns: *** -Current exercise habits: *** -Educated on {CCM HLD Counseling:25126} -{CCMPHARMDINTERVENTION:25122}  Needs repeat lipid panel!!  Depression/Anxiety (Goal: ***) -{US controlled/uncontrolled:25276} -Current treatment: Alprazolam 0.25 mg 1 tablet as needed for anxiety -Medications previously tried/failed: *** -PHQ9: *** -GAD7: *** -Connected with *** for mental health support -Educated on {CCM mental health counseling:25127} -{CCMPHARMDINTERVENTION:25122}  Osteoporosis (Goal ***) -{US controlled/uncontrolled:25276} -Last DEXA Scan: 08/16/18  T-Score femoral neck: -2.8  T-Score total hip: -2.8  T-Score lumbar spine: -2.2  T-Score forearm radius: n/a  10-year probability of major osteoporotic fracture: n/a  10-year probability of hip fracture: n/a -Patient is a candidate for pharmacologic treatment due to T-Score < -2.5 in femoral neck and T-Score < -2.5 in total hip  -Current treatment  Vitamin D 1000 units (5000 units?) daily Calcium? (In multivitamin?) -Medications previously tried: none  -{Osteoporosis Counseling:23892} -{CCMPHARMDINTERVENTION:25122} -treatment? Repeat DEXA!  Allergic rhinitis (Goal: ***) -{US controlled/uncontrolled:25276} -Current treatment  Cetirizine 10 mg 1 tablet daily -Medications previously tried: ***  -{CCMPHARMDINTERVENTION:25122}  Pain (Goal: ***) -{US  controlled/uncontrolled:25276} -Current treatment  Meloxicam 7.5 mg daily as needed for pain -Medications previously tried: ***  -{CCMPHARMDINTERVENTION:25122}  Vaccines   Reviewed and discussed patient's vaccination history.     Health Maintenance -Vaccine gaps: COVID booster, shingrix -Current therapy:  Vitamin C daily - how much? Multivitamin 1 tablet daily -Educated on {ccm supplement counseling:25128} -{CCM Patient satisfied:25129} -{CCMPHARMDINTERVENTION:25122}  Patient Goals/Self-Care Activities Patient will:  - {pharmacypatientgoals:24919}  Follow Up Plan: {CM FOLLOW UP VWUJ:81191}    Medication Assistance: {MEDASSISTANCEINFO:25044}  Compliance/Adherence/Medication fill history: Care Gaps: Shingrix, COVID booster  Star-Rating Drugs: Losartan- Hydrochlorothiazide 100-12.5mg  - [facility] Pravastatin 20mg  - [facility]  Patient[facility] is:  [id number] [place], [address] [place] [address] Phone: [phone] Fax: [phone]  Uses pill box? {Yes or If no, why not?:20788} Pt endorses ***% compliance  We discussed: {Pharmacy options:24294} Patient decided to: {US Pharmacy GMWN:02725}  Care Plan and Follow Up Patient Decision:  {FOLLOWUP:24991}  Plan: {CM FOLLOW UP DGUY:40347}  Jeni Salles, PharmD, [place] Pharmacist [place] [facility]

## 2021-05-04 ENCOUNTER — Encounter: Payer: Self-pay | Admitting: Family Medicine

## 2021-05-04 ENCOUNTER — Telehealth (INDEPENDENT_AMBULATORY_CARE_PROVIDER_SITE_OTHER): Payer: Medicare Other | Admitting: Family Medicine

## 2021-05-04 VITALS — Temp 99.7°F | Wt 144.0 lb

## 2021-05-04 DIAGNOSIS — J309 Allergic rhinitis, unspecified: Secondary | ICD-10-CM

## 2021-05-04 DIAGNOSIS — U071 COVID-19: Secondary | ICD-10-CM

## 2021-05-04 MED ORDER — BENZONATATE 100 MG PO CAPS: 100.0000 mg | ORAL_CAPSULE | Freq: Two times a day (BID) | ORAL | 2 refills | Status: DC | PRN

## 2021-05-04 MED ORDER — CETIRIZINE HCL 10 MG PO TABS: 10.0000 mg | ORAL_TABLET | Freq: Every day | ORAL | 3 refills | Status: AC

## 2021-05-04 MED ORDER — GUAIFENESIN ER 600 MG PO TB12: 600.0000 mg | ORAL_TABLET | Freq: Two times a day (BID) | ORAL | 2 refills | Status: DC | PRN

## 2021-05-04 NOTE — Progress Notes (Signed)
Virtual Visit via Video Note  I connected with Bonnie Moon  on 05/04/21 at 11:30 AM EDT by a video enabled telemedicine application and verified that I am speaking with the correct person using two identifiers. Video was not working so video completed by phone.   Location patient: home Location provider: [place], [place] [address] Persons participating in the virtual visit: patient, provider  I discussed the limitations of evaluation and management by telemedicine and the availability of in person appointments. The patient expressed understanding and agreed to proceed.   Bonnie Moon DOB: 1951/08/03 Encounter date: 05/04/2021  This is a 70 y.o. female who presents with Chief Complaint  Patient presents with   Results    Patient states the home Covid test was positive today, states both of her parents tested positive   Fatigue    X1 day   Sinus Problem    Patient complains of recurrent sinus pressure, treated by an ENT   Chills    Noted since this morning   Cough    Non-productive x1 day    History of present illness: Parents have had covid this week in their house (mom is 95, dad 55) and they are doing well; just tired and sleeping all the time. Mom felt bad Monday, tues and tested positive weds. Dad tested positive Saturday.   Yesterday started feeling tired. She has chronic sinus issues and has had congestion for last 2 months unless she puts warm cloth on sinuses. Kept thinking it was just sinus until she started coughing a lot last night. Highest temp was 100.1. just took tylenol. Cough is pretty persistent. Trying to stay active. Chest burns from coughing. Not feeling short of breath or wheezy. Oxygen was 95, 96, 97 when she has checked.   Mom has been taking oral anti-virals and having a lot of stomach pain; so really prefers to avoid.   Bp 136/87 100 HR earlier. Ox 95-97. Temp 99.7 this morning; 100.1; after tylenol.   She did  have 3 covid vaccinations, did not get the 4th.    Allergies  Allergen Reactions   Lipitor [Atorvastatin Calcium]    Current Meds  Medication Sig   ALPRAZolam (XANAX) 0.25 MG tablet 1 tab orally as needed for anxiety not to exceed more than two a day   Ascorbic Acid (VITAMIN C PO) Take 250 mg by mouth in the morning and at bedtime.   cetirizine (ZYRTEC ALLERGY) 10 MG tablet Take 1 tablet (10 mg total) by mouth daily.   Cholecalciferol (VITAMIN D-3) 1000 UNITS CAPS Take 2,000 Units by mouth.   losartan-hydrochlorothiazide (HYZAAR) 100-12.5 MG tablet Take 1 tablet by mouth daily.   meloxicam (MOBIC) 7.5 MG tablet Take 1 tablet (7.5 mg total) by mouth daily as needed for pain.   Multiple Vitamins-Minerals (MULTIVITAMIN PO) Take by mouth. gummies 2 daily   pravastatin (PRAVACHOL) 20 MG tablet TAKE 1 TABLET BY MOUTH EVERY DAY    Review of Systems  Constitutional:  Negative for chills, fatigue and fever.  HENT:  Positive for congestion, postnasal drip, sinus pressure and sore throat. Negative for ear pain and sinus pain.   Respiratory:  Positive for cough. Negative for shortness of breath and wheezing.   Cardiovascular:  Negative for chest pain.   Objective:  Temp 99.7 F (37.6 C)   Wt 144 lb (65.3 kg)   LMP 07/28/2002   BMI 26.34 kg/m   Weight: 144 lb (65.3  kg)   BP Readings from Last 3 Encounters:  12/22/20 125/74  12/22/20 (!) 140/100  06/16/20 140/90   Wt Readings from Last 3 Encounters:  05/04/21 144 lb (65.3 kg)  12/22/20 144 lb (65.3 kg)  12/22/20 144 lb 14.4 oz (65.7 kg)    EXAM:  GENERAL: alert, oriented.  HEENT: nasal congestion apparent during conversation.  NECK: normal movements of the head and neck  LUNGS: occasional hacking cough noted during conversation. No noted SOB.  PSYCH/NEURO: pleasant and cooperative, no obvious depression or anxiety, speech and thought processing grossly intact   Assessment/Plan  1. COVID We discussed option of anti-viral  treatment; she prefers to hold off. Still has window of benefit if symptoms do worsen for her. Suggested rest, fluids, mucinex. Continue with typical allergy tx. Tessalon for cough if needed. Ok for cough med at night if she would like this; but currently not needed. Continue to monitor pulse ox; let us know if below 90. Ok to treat fever with tylenol. [place] for zinc and continuation of other supplements. Honey can be helpful for cough.  2. Allergic rhinitis, unspecified seasonality, unspecified trigger - cetirizine (ZYRTEC ALLERGY) 10 MG tablet; Take 1 tablet (10 mg total) by mouth daily.  Dispense: 90 tablet; Refill: 3    Has responded well in past to prednisone and antibiotic.  I discussed the assessment and treatment plan with the patient. The patient was provided an opportunity to ask questions and all were answered. The patient agreed with the plan and demonstrated an understanding of the instructions.   The patient was advised to call back or seek an in-person evaluation if the symptoms worsen or if the condition fails to improve as anticipated.  I provided 20 minutes of non-face-to-face time during this encounter.   Micheline Rough, MD

## 2021-05-07 ENCOUNTER — Other Ambulatory Visit: Payer: Self-pay

## 2021-05-07 ENCOUNTER — Ambulatory Visit (AMBULATORY_SURGERY_CENTER): Payer: Medicare Other | Admitting: *Deleted

## 2021-05-07 VITALS — Ht 63.0 in | Wt 144.0 lb

## 2021-05-07 DIAGNOSIS — Z86010 Personal history of colonic polyps: Secondary | ICD-10-CM

## 2021-05-07 NOTE — Progress Notes (Signed)
Patient's pre-visit was done today over the phone with the patient due to COVID-19 pandemic. Name,DOB and address verified. Insurance verified. Patient denies any allergies to Eggs and Soy. Patient denies any problems with anesthesia/sedation. Patient denies taking diet pills or blood thinners. No home Oxygen. Patient is aware of our care-partner policy and 0000000 safety protocol.   The patient is COVID-19 vaccinated.  Patient had + covid test on 05/04/21-she has cough and congestion only-pt denies fever. Notified patient of our covid policy.   Patient wants to "think about the prep", she wants to do the pills but wants to think about it, new PV appointment made for tomorrow at 1 pm so we can go over prep information-pt aware.

## 2021-05-08 ENCOUNTER — Ambulatory Visit (AMBULATORY_SURGERY_CENTER): Payer: Medicare Other | Admitting: *Deleted

## 2021-05-08 DIAGNOSIS — Z86010 Personal history of colonic polyps: Secondary | ICD-10-CM

## 2021-05-08 NOTE — Progress Notes (Signed)
PV done 05-07-2021 by Robbin- pthas Covid- was feeling bad, asked for a call after decided about prep today for instructions   Decided not to do Tablets, no Golytely, did not want Plenvu   She has asked to do Miralax prep- as she has done this in the past with good results-  Pt verified name, DOB, address and insurance during PV today. Pt mailed instruction packet to included paper to complete and mail back to [facility] with addressed and stamped envelope, Emmi video, copy of consent form to read and not return, and instructions. Pt encouraged to call with questions or issues.

## 2021-05-18 ENCOUNTER — Telehealth: Payer: Self-pay | Admitting: Gastroenterology

## 2021-05-18 NOTE — Telephone Encounter (Signed)
Hi Dr. Fuller Plan, this patient just called to cancel procedure that was scheduled on 05/21/21 because her daughter, who was her ride, was not able to get the day off from work. Patient has rescheduled to 07/30/21. Thank you.

## 2021-05-21 ENCOUNTER — Encounter: Payer: Medicare Other | Admitting: Gastroenterology

## 2021-06-05 ENCOUNTER — Other Ambulatory Visit: Payer: Self-pay | Admitting: Family Medicine

## 2021-06-05 DIAGNOSIS — I10 Essential (primary) hypertension: Secondary | ICD-10-CM

## 2021-06-22 ENCOUNTER — Telehealth: Payer: Self-pay | Admitting: Family Medicine

## 2021-06-22 NOTE — Telephone Encounter (Signed)
Spoke with the patient and informed her a visit is needed for evaluation.  Offered an appt today with Dr Martinique and the patient declined.  Appt scheduled with Dr Maudie Mercury on 9/27.

## 2021-06-22 NOTE — Telephone Encounter (Signed)
PT called to advise that her left side feels like she has a ear ache and whenever she swallows there is a pain in her ear as well. She states she normally gets a antibiotic and would like to see if her Dr will call her in one.

## 2021-06-23 ENCOUNTER — Ambulatory Visit: Payer: Medicare Other

## 2021-06-23 ENCOUNTER — Telehealth: Payer: Medicare Other | Admitting: Family Medicine

## 2021-07-10 ENCOUNTER — Telehealth: Payer: Self-pay | Admitting: Pharmacist

## 2021-07-10 NOTE — Chronic Care Management (AMB) (Signed)
    Chronic Care Management Pharmacy Assistant   Name: MICHELYN SCULLIN  MRN: [id number] DOB: Jan 25, 1951  Reason for Encounter: Schedule follow up visit Patient scheduled for 10/07/2021 at 2:15  Care Gaps: AWV - 06/23/2021 Shingrix - never done Covid vaccine - overdue Flu vaccine - overdue  Star Rating Drugs: Hyzaar 100-12.5 mg - [facility] Pravastatin 20 mg - last filled 03/05/2021 90 DS at [place] [phone]

## 2021-07-27 ENCOUNTER — Telehealth: Payer: Self-pay | Admitting: Pharmacist

## 2021-07-27 NOTE — Chronic Care Management (AMB) (Signed)
    Chronic Care Management Pharmacy Assistant   Name: KENDAHL BUMGARDNER  MRN: [id number] DOB: Feb 16, 1951  Reason for Encounter: Reschedule appointment to an initial appointment.  Patient rescheduled to 10/19/2020 at 11:00 for initial, patient requests a phone visit.   Medications: Outpatient Encounter Medications as of 07/27/2021  Medication Sig   ALPRAZolam (XANAX) 0.25 MG tablet 1 tab orally as needed for anxiety not to exceed more than two a day   Ascorbic Acid (VITAMIN C PO) Take 250 mg by mouth in the morning and at bedtime.   benzonatate (TESSALON) 100 MG capsule Take 1 capsule (100 mg total) by mouth 2 (two) times daily as needed for cough.   cetirizine (ZYRTEC ALLERGY) 10 MG tablet Take 1 tablet (10 mg total) by mouth daily.   Cholecalciferol (VITAMIN D-3) 1000 UNITS CAPS Take 2,000 Units by mouth.   guaiFENesin (MUCINEX) 600 MG 12 hr tablet Take 1 tablet (600 mg total) by mouth 2 (two) times daily as needed.   losartan-hydrochlorothiazide (HYZAAR) 100-12.5 MG tablet TAKE 1 TABLET BY MOUTH EVERY DAY   Multiple Vitamins-Minerals (MULTIVITAMIN PO) Take by mouth. gummies 2 daily   pravastatin (PRAVACHOL) 20 MG tablet TAKE 1 TABLET BY MOUTH EVERY DAY   No facility-administered encounter medications on file as of 07/27/2021.    Care Gaps: AWV - scheduled for 08/11/2021 Shingrix - never done Pneumovax - overdue Covid vaccine - overdue Flu vaccine - due  Star Rating Drugs: Pravastatin 20 mg - [facility] Losartan HCTZ 100/12.5 mg - last filled 06/05/2021 90DS at [place] Pharmacist Assistant [phone]

## 2021-07-30 ENCOUNTER — Encounter: Payer: Self-pay | Admitting: Gastroenterology

## 2021-07-30 ENCOUNTER — Ambulatory Visit (AMBULATORY_SURGERY_CENTER): Payer: Medicare Other | Admitting: Gastroenterology

## 2021-07-30 ENCOUNTER — Other Ambulatory Visit: Payer: Self-pay

## 2021-07-30 VITALS — BP 131/71 | HR 60 | Temp 97.6°F | Resp 19 | Ht 63.0 in | Wt 144.0 lb

## 2021-07-30 DIAGNOSIS — Z86010 Personal history of colonic polyps: Secondary | ICD-10-CM | POA: Diagnosis not present

## 2021-07-30 DIAGNOSIS — D122 Benign neoplasm of ascending colon: Secondary | ICD-10-CM | POA: Diagnosis not present

## 2021-07-30 MED ORDER — SODIUM CHLORIDE 0.9 % IV SOLN: 500.0000 mL | Freq: Once | INTRAVENOUS | Status: DC

## 2021-07-30 NOTE — Progress Notes (Signed)
Pt's states no medical or surgical changes since previsit or office visit. VS assessed by C.W 

## 2021-07-30 NOTE — Progress Notes (Signed)
To PACU< VSS. Report to Rn.tb 

## 2021-07-30 NOTE — Patient Instructions (Signed)
Handouts given on polyps and diverticulosis  YOU HAD AN ENDOSCOPIC PROCEDURE TODAY AT [facility]:   Refer to the procedure report that was given to you for any specific questions about what was found during the examination.  If the procedure report does not answer your questions, please call your gastroenterologist to clarify.  If you requested that your care partner not be given the details of your procedure findings, then the procedure report has been included in a sealed envelope for you to review at your convenience later.  YOU SHOULD EXPECT: Some feelings of bloating in the abdomen. Passage of more gas than usual.  Walking can help get rid of the air that was put into your GI tract during the procedure and reduce the bloating. If you had a lower endoscopy (such as a colonoscopy or flexible sigmoidoscopy) you may notice spotting of blood in your stool or on the toilet paper. If you underwent a bowel prep for your procedure, you may not have a normal bowel movement for a few days.  Please Note:  You might notice some irritation and congestion in your nose or some drainage.  This is from the oxygen used during your procedure.  There is no need for concern and it should clear up in a day or so.  SYMPTOMS TO REPORT IMMEDIATELY:  Following lower endoscopy (colonoscopy or flexible sigmoidoscopy):  Excessive amounts of blood in the stool  Significant tenderness or worsening of abdominal pains  Swelling of the abdomen that is new, acute  Fever of 100F or higher   For urgent or emergent issues, a gastroenterologist can be reached at any hour by calling [phone]. Do not use MyChart messaging for urgent concerns.    DIET:  We do recommend a small meal at first, but then you may proceed to your regular diet.  Drink plenty of fluids but you should avoid alcoholic beverages for 24 hours.  ACTIVITY:  You should plan to take it easy for the rest of today and you should NOT DRIVE  or use heavy machinery until tomorrow (because of the sedation medicines used during the test).    FOLLOW UP: Our staff will call the number listed on your records 48-72 hours following your procedure to check on you and address any questions or concerns that you may have regarding the information given to you following your procedure. If we do not reach you, we will leave a message.  We will attempt to reach you two times.  During this call, we will ask if you have developed any symptoms of COVID 19. If you develop any symptoms (ie: fever, flu-like symptoms, shortness of breath, cough etc.) before then, please call [phone].  If you test positive for Covid 19 in the 2 weeks post procedure, please call and report this information to [place].    If any biopsies were taken you will be contacted by phone or by letter within the next 1-3 weeks.  Please call us at [phone] if you have not heard about the biopsies in 3 weeks.    SIGNATURES/CONFIDENTIALITY: You and/or your care partner have signed paperwork which will be entered into your electronic medical record.  These signatures attest to the fact that that the information above on your After Visit Summary has been reviewed and is understood.  Full responsibility of the confidentiality of this discharge information lies with you and/or your care-partner.

## 2021-07-30 NOTE — Progress Notes (Signed)
Called to room to assist during endoscopic procedure.  Patient ID and intended procedure confirmed with present staff. Received instructions for my participation in the procedure from the performing physician.  

## 2021-07-30 NOTE — Op Note (Signed)
[facility] Patient Name: Makhayla Mcmurry Procedure Date: 07/30/2021 8:57 AM MRN: [id number] Endoscopist: Ladene Artist , MD Age: 70 Referring MD:  Date of Birth: [birth year] Gender: Female Account #: [id number] Procedure:                Colonoscopy Indications:              Surveillance: Personal history of adenomatous                            polyps on last colonoscopy > 5 years ago Medicines:                Monitored Anesthesia Care Procedure:                Pre-Anesthesia Assessment:                           - Prior to the procedure, a History and Physical                            was performed, and patient medications and                            allergies were reviewed. The patient's tolerance of                            previous anesthesia was also reviewed. The risks                            and benefits of the procedure and the sedation                            options and risks were discussed with the patient.                            All questions were answered, and informed consent                            was obtained. Prior Anticoagulants: The patient has                            taken no previous anticoagulant or antiplatelet                            agents. ASA Grade Assessment: II - A patient with                            mild systemic disease. After reviewing the risks                            and benefits, the patient was deemed in                            satisfactory condition to undergo the procedure.  After obtaining informed consent, the colonoscope                            was passed under direct vision. Throughout the                            procedure, the patient's blood pressure, pulse, and                            oxygen saturations were monitored continuously. The                            Olympus PCF-H190DL ([id number]) Colonoscope was                            introduced through the  anus and advanced to the the                            cecum, identified by appendiceal orifice and                            ileocecal valve. The ileocecal valve, appendiceal                            orifice, and rectum were photographed. The quality                            of the bowel preparation was good. The colonoscopy                            was somewhat difficult due to significant looping                            and a tortuous colon. The patient tolerated the                            procedure well. Scope In: 9:04:08 AM Scope Out: 9:24:42 AM Scope Withdrawal Time: 0 hours 14 minutes 32 seconds  Total Procedure Duration: 0 hours 20 minutes 34 seconds  Findings:                 The perianal and digital rectal examinations were                            normal.                           A 6 mm polyp was found in the ascending colon. The                            polyp was sessile. The polyp was removed with a                            cold snare. Resection and retrieval were complete.  A few small-mouthed diverticula were found in the                            left colon. There was no evidence of diverticular                            bleeding.                           The exam was otherwise without abnormality on                            direct and retroflexion views. Complications:            No immediate complications. Estimated blood loss:                            None. Estimated Blood Loss:     Estimated blood loss: none. Impression:               - One 6 mm polyp in the ascending colon, removed                            with a cold snare. Resected and retrieved.                           - Mild diverticulosis in the left colon.                           - The examination was otherwise normal on direct                            and retroflexion views. Recommendation:           - Repeat colonoscopy after studies are complete  for                            surveillance based on pathology results.                           - Patient has a contact number available for                            emergencies. The signs and symptoms of potential                            delayed complications were discussed with the                            patient. Return to normal activities tomorrow.                            Written discharge instructions were provided to the                            patient.                           -  Resume previous diet.                           - Continue present medications.                           - Await pathology results. Ladene Artist, MD 07/30/2021 9:27:46 AM This report has been signed electronically.

## 2021-07-30 NOTE — Progress Notes (Signed)
History & Physical  Primary Care Physician:  Caren Macadam, MD Primary Gastroenterologist: Lucio Edward, MD  CHIEF COMPLAINT:  Personal history of colon polyps   HPI: Bonnie Moon is a 70 y.o. female with a personal history of adenomatous colon polyps presenting for colonoscopy.   Past Medical History:  Diagnosis Date   Allergy    GERD (gastroesophageal reflux disease)    Hyperlipidemia    IBS (irritable bowel syndrome)    Nephrolithiasis    Osteopenia    Thyroid nodule    Tubular adenoma of colon 02/2009   Vitamin D insufficiency     Past Surgical History:  Procedure Laterality Date   BREAST ENHANCEMENT SURGERY     COLONOSCOPY  04/11/2014   Dr.Annalise Mcdiarmid   PARATHYROIDECTOMY  01/17/2017   done by Dr. Oran Rein- at [facility] in [place]  (1/2 parathyroid remains)   TONSILLECTOMY     TOTAL HIP ARTHROPLASTY Left 05/2008   TOTAL HIP ARTHROPLASTY Right 04/02/2020   Dr Reynolds Bowl patient    Prior to Admission medications   Medication Sig Start Date End Date Taking? Authorizing Provider  Ascorbic Acid (VITAMIN C PO) Take 250 mg by mouth in the morning and at bedtime.   Yes [provider]  cetirizine (ZYRTEC ALLERGY) 10 MG tablet Take 1 tablet (10 mg total) by mouth daily. 05/04/21  Yes Koberlein, Steele Berg, MD  Cholecalciferol (VITAMIN D-3) 1000 UNITS CAPS Take 2,000 Units by mouth.   Yes [provider]  losartan-hydrochlorothiazide (HYZAAR) 100-12.5 MG tablet TAKE 1 TABLET BY MOUTH EVERY DAY 06/05/21  Yes Koberlein, Steele Berg, MD  Multiple Vitamins-Minerals (MULTIVITAMIN PO) Take by mouth. gummies 2 daily   Yes [provider]  pravastatin (PRAVACHOL) 20 MG tablet TAKE 1 TABLET BY MOUTH EVERY DAY 03/05/21  Yes Koberlein, Steele Berg, MD  ALPRAZolam (XANAX) 0.25 MG tablet 1 tab orally as needed for anxiety not to exceed more than two a day 06/16/20   Koberlein, Junell C, MD  benzonatate (TESSALON) 100 MG capsule Take 1 capsule (100 mg total) by mouth 2 (two) times  daily as needed for cough. Patient not taking: Reported on 07/30/2021 05/04/21   Caren Macadam, MD  guaiFENesin (MUCINEX) 600 MG 12 hr tablet Take 1 tablet (600 mg total) by mouth 2 (two) times daily as needed. Patient not taking: Reported on 07/30/2021 05/04/21   Caren Macadam, MD    Current Outpatient Medications  Medication Sig Dispense Refill   Ascorbic Acid (VITAMIN C PO) Take 250 mg by mouth in the morning and at bedtime.     cetirizine (ZYRTEC ALLERGY) 10 MG tablet Take 1 tablet (10 mg total) by mouth daily. 90 tablet 3   Cholecalciferol (VITAMIN D-3) 1000 UNITS CAPS Take 2,000 Units by mouth.     losartan-hydrochlorothiazide (HYZAAR) 100-12.5 MG tablet TAKE 1 TABLET BY MOUTH EVERY DAY 90 tablet 0   Multiple Vitamins-Minerals (MULTIVITAMIN PO) Take by mouth. gummies 2 daily     pravastatin (PRAVACHOL) 20 MG tablet TAKE 1 TABLET BY MOUTH EVERY DAY 90 tablet 1   ALPRAZolam (XANAX) 0.25 MG tablet 1 tab orally as needed for anxiety not to exceed more than two a day 30 tablet 0   benzonatate (TESSALON) 100 MG capsule Take 1 capsule (100 mg total) by mouth 2 (two) times daily as needed for cough. (Patient not taking: Reported on 07/30/2021) 30 capsule 2   guaiFENesin (MUCINEX) 600 MG 12 hr tablet Take 1 tablet (600 mg total) by mouth 2 (  two) times daily as needed. (Patient not taking: Reported on 07/30/2021) 60 tablet 2   Current Facility-Administered Medications  Medication Dose Route Frequency Provider Last Rate Last Admin   0.9 %  sodium chloride infusion  500 mL Intravenous Once Ladene Artist, MD        Allergies as of 07/30/2021 - Review Complete 07/30/2021  Allergen Reaction Noted   Lipitor [atorvastatin calcium]  02/23/2019    Family History  Problem Relation Age of Onset   Osteopenia Mother    Heart disease Mother        a. fib   High Cholesterol Mother    Heart failure Mother        pacemaker   Other Mother        parathyroid disease   Atrial fibrillation Father     Alzheimer's disease Father    Other Father        pacemaker   Other Sister        parathyroid   Thyroid nodules Brother    Thyroid disease Daughter        nodule, cancer   High Cholesterol Daughter    Colon cancer Neg Hx    Pancreatic cancer Neg Hx    Rectal cancer Neg Hx    Stomach cancer Neg Hx    Esophageal cancer Neg Hx    Colon polyps Neg Hx     Social History   Socioeconomic History   Marital status: Widowed    Spouse name: Not on file   Number of children: Not on file   Years of education: 14   Highest education level: Not on file  Occupational History   Occupation: Self-employed    Comment: Event and Meeting Planning  Tobacco Use   Smoking status: Never   Smokeless tobacco: Never  Vaping Use   Vaping Use: Never used  Substance and Sexual Activity   Alcohol use: Not Currently    Comment: one drink every 2-3 weeks   Drug use: No   Sexual activity: Not Currently    Partners: Male    Birth control/protection: Abstinence  Other Topics Concern   Not on file  Social History Narrative   Regular exercise-yes   Caffeine Use-yes   Lives alone   [place] [employer]   Regular exercise   Diet is good   Social Determinants of Radio broadcast assistant Strain: Not on file  Food Insecurity: Not on file  Transportation Needs: Not on file  Physical Activity: Not on file  Stress: Not on file  Social Connections: Not on file  Intimate Partner Violence: Not on file    Review of Systems:  All systems reviewed an negative except where noted in HPI.  Gen: Denies any fever, chills, sweats, anorexia, fatigue, weakness, malaise, weight loss, and sleep disorder CV: Denies chest pain, angina, palpitations, syncope, orthopnea, PND, peripheral edema, and claudication. Resp: Denies dyspnea at rest, dyspnea with exercise, cough, sputum, wheezing, coughing up blood, and pleurisy. GI: Denies vomiting blood, jaundice, and fecal incontinence.   Denies dysphagia or  odynophagia. GU : Denies urinary burning, blood in urine, urinary frequency, urinary hesitancy, nocturnal urination, and urinary incontinence. MS: Denies joint pain, limitation of movement, and swelling, stiffness, low back pain, extremity pain. Denies muscle weakness, cramps, atrophy.  Derm: Denies rash, itching, dry skin, hives, moles, warts, or unhealing ulcers.  Psych: Denies depression, anxiety, memory loss, suicidal ideation, hallucinations, paranoia, and confusion. Heme: Denies bruising, bleeding, and enlarged lymph nodes.  Neuro:  Denies any headaches, dizziness, paresthesias. Endo:  Denies any problems with DM, thyroid, adrenal function.   Physical Exam: General:  Alert, well-developed, in NAD Head:  Normocephalic and atraumatic. Eyes:  Sclera clear, no icterus.   Conjunctiva pink. Ears:  Normal auditory acuity. Mouth:  No deformity or lesions.  Neck:  Supple; no masses . Lungs:  Clear throughout to auscultation.   No wheezes, crackles, or rhonchi. No acute distress. Heart:  Regular rate and rhythm; no murmurs. Abdomen:  Soft, nondistended, nontender. No masses, hepatomegaly. No obvious masses.  Normal bowel .    Rectal:  Deferred   Msk:  Symmetrical without gross deformities.. Pulses:  Normal pulses noted. Extremities:  Without edema. Neurologic:  Alert and  oriented x4;  grossly normal neurologically. Skin:  Intact without significant lesions or rashes. Cervical Nodes:  No significant cervical adenopathy. Psych:  Alert and cooperative. Normal mood and affect.   Impression / Plan:   Personal history of adenomatous colon polyps for colonoscopy.     This patient is appropriate for endoscopic procedures in the ambulatory setting.    Pricilla Riffle. Fuller Plan  07/30/2021, 9:01 AM See Shea Evans, [facility], to contact our on call provider

## 2021-08-03 ENCOUNTER — Telehealth: Payer: Self-pay

## 2021-08-03 NOTE — Telephone Encounter (Signed)
Second post procedure follow up call, no answer 

## 2021-08-04 ENCOUNTER — Ambulatory Visit: Payer: Medicare Other

## 2021-08-10 ENCOUNTER — Encounter: Payer: Self-pay | Admitting: Gastroenterology

## 2021-08-11 ENCOUNTER — Ambulatory Visit (INDEPENDENT_AMBULATORY_CARE_PROVIDER_SITE_OTHER): Payer: Medicare Other

## 2021-08-11 VITALS — Ht 62.0 in | Wt 142.0 lb

## 2021-08-11 DIAGNOSIS — Z0000 Encounter for general adult medical examination without abnormal findings: Secondary | ICD-10-CM | POA: Diagnosis not present

## 2021-08-11 NOTE — Progress Notes (Signed)
Subjective:   Bonnie Moon is a 70 y.o. female who presents for Medicare Annual (Subsequent) preventive examination.  Review of Systems    No ROS  Cardiac Risk Factors include: advanced age (>24men, >3 women);hypertension     Objective:    Today's Vitals   08/11/21 1300  Weight: 142 lb (64.4 kg)  Height: 5\' 2"  (1.575 m)   Body mass index is 25.97 kg/m.  Advanced Directives 08/11/2021 03/25/2014  Does Patient Have a Medical Advance Directive? Yes Patient has advance directive, copy not in chart  Type of Advance Directive - [place];Living will  Does patient want to make changes to medical advance directive? No - Patient declined No change requested  Copy of [place] in Chart? - Copy requested from family  Pre-existing out of facility DNR order (yellow form or pink MOST form) - No    Current Medications (verified) Outpatient Encounter Medications as of 08/11/2021  Medication Sig   ALPRAZolam (XANAX) 0.25 MG tablet 1 tab orally as needed for anxiety not to exceed more than two a day   Ascorbic Acid (VITAMIN C PO) Take 250 mg by mouth in the morning and at bedtime.   cetirizine (ZYRTEC ALLERGY) 10 MG tablet Take 1 tablet (10 mg total) by mouth daily.   Cholecalciferol (VITAMIN D-3) 1000 UNITS CAPS Take 2,000 Units by mouth.   Multiple Vitamins-Minerals (MULTIVITAMIN PO) Take by mouth. gummies 2 daily   pravastatin (PRAVACHOL) 20 MG tablet TAKE 1 TABLET BY MOUTH EVERY DAY   benzonatate (TESSALON) 100 MG capsule Take 1 capsule (100 mg total) by mouth 2 (two) times daily as needed for cough. (Patient not taking: No sig reported)   guaiFENesin (MUCINEX) 600 MG 12 hr tablet Take 1 tablet (600 mg total) by mouth 2 (two) times daily as needed. (Patient not taking: No sig reported)   losartan-hydrochlorothiazide (HYZAAR) 100-12.5 MG tablet TAKE 1 TABLET BY MOUTH EVERY DAY   No facility-administered encounter medications on file as of  08/11/2021.    Allergies (verified) Lipitor [atorvastatin calcium]   History: Past Medical History:  Diagnosis Date   Allergy    GERD (gastroesophageal reflux disease)    Hyperlipidemia    IBS (irritable bowel syndrome)    Nephrolithiasis    Osteopenia    Thyroid nodule    Tubular adenoma of colon 02/2009   Vitamin D insufficiency    Past Surgical History:  Procedure Laterality Date   BREAST ENHANCEMENT SURGERY     COLONOSCOPY  04/11/2014   Dr.Stark   PARATHYROIDECTOMY  01/17/2017   done by Dr. Oran Rein- at [facility] in [place]  (1/2 parathyroid remains)   TONSILLECTOMY     TOTAL HIP ARTHROPLASTY Left 05/2008   TOTAL HIP ARTHROPLASTY Right 04/02/2020   Dr Reynolds Bowl patient   Family History  Problem Relation Age of Onset   Osteopenia Mother    Heart disease Mother        a. fib   High Cholesterol Mother    Heart failure Mother        pacemaker   Other Mother        parathyroid disease   Atrial fibrillation Father    Alzheimer's disease Father    Other Father        pacemaker   Other Sister        parathyroid   Thyroid nodules Brother    Thyroid disease Daughter        nodule, cancer   High  Cholesterol Daughter    Colon cancer Neg Hx    Pancreatic cancer Neg Hx    Rectal cancer Neg Hx    Stomach cancer Neg Hx    Esophageal cancer Neg Hx    Colon polyps Neg Hx    Social History   Socioeconomic History   Marital status: Widowed    Spouse name: Not on file   Number of children: Not on file   Years of education: 14   Highest education level: Not on file  Occupational History   Occupation: Self-employed    Comment: Event and Meeting Planning  Tobacco Use   Smoking status: Never   Smokeless tobacco: Never  Vaping Use   Vaping Use: Never used  Substance and Sexual Activity   Alcohol use: Not Currently    Comment: one drink every 2-3 weeks   Drug use: No   Sexual activity: Not Currently    Partners: Male    Birth control/protection: Abstinence  Other  Topics Concern   Not on file  Social History Narrative   Regular exercise-yes   Caffeine Use-yes   Lives alone   [place]   Regular exercise   Diet is good   Social Determinants of Radio broadcast assistant Strain: Low Risk    Difficulty of Paying Living Expenses: Not hard at all  Food Insecurity: No Food Insecurity   Worried About Charity fundraiser in the Last Year: Never true   Arboriculturist in the Last Year: Never true  Transportation Needs: No Transportation Needs   Lack of Transportation (Medical): No   Lack of Transportation (Non-Medical): No  Physical Activity: Sufficiently Active   Days of Exercise per Week: 4 days   Minutes of Exercise per Session: 50 min  Stress: No Stress Concern Present   Feeling of Stress : Not at all  Social Connections: Moderately Integrated   Frequency of Communication with Friends and Family: More than three times a week   Frequency of Social Gatherings with Friends and Family: More than three times a week   Attends Religious Services: More than 4 times per year   Active Member of [employer] or Organizations: Yes   Attends Archivist Meetings: More than 4 times per year   Marital Status: Widowed    Clinical Intake:  Pre-visit preparation completed: Yes  Diabetic? No  Diet: Regular  Interpreter Needed?: No  Activities of Daily Living In your present state of health, do you have any difficulty performing the following activities: 08/11/2021  Hearing? N  Vision? N  Difficulty concentrating or making decisions? N  Walking or climbing stairs? N  Dressing or bathing? N  Doing errands, shopping? N  Preparing Food and eating ? N  Using the Toilet? N  In the past six months, have you accidently leaked urine? N  Do you have problems with loss of bowel control? N  Managing your Medications? N  Managing your Finances? N  Housekeeping or managing your Housekeeping? N  Some recent data might be hidden     Patient Care Team: Caren Macadam, MD as PCP - General (Family Medicine) [name], MD as Attending Physician (Obstetrics and Gynecology) Ladene Artist, MD as Attending Physician (Gastroenterology) Clydie Braun, MD (Internal Medicine) Clydie Braun, MD (Internal Medicine) Viona Gilmore, [facility] as Pharmacist (Pharmacist)  Indicate any recent Medical Services you may have received from other than [facility] providers in the past year (date may be  approximate).     Assessment:   This is a routine wellness examination for Bonnie Moon. Virtual Visit via Telephone Note  I connected with  Bonnie Moon on 08/11/21 at  1:00 PM EST by telephone and verified that I am speaking with the correct person using two identifiers.  Location: Patient: Home Provider: Office Persons participating in the virtual visit: patient/Nurse Health Advisor   I discussed the limitations, risks, security and privacy concerns of performing an evaluation and management service by telephone and the availability of in person appointments. The patient expressed understanding and agreed to proceed.  Interactive audio and video telecommunications were attempted between this nurse and patient, however failed, due to patient having technical difficulties OR patient did not have access to video capability.  We continued and completed visit with audio only.  Some vital signs may be absent or patient reported.   Criselda Peaches, LPN   Hearing/Vision screen Hearing Screening - Comments:: No difficulty hearing Vision Screening - Comments:: Wears reading glasses. Followed by Dr Clovis Fredrickson.  Dietary issues and exercise activities discussed: Current Exercise Habits: Home exercise routine, Type of exercise: walking, Time (Minutes): 55, Frequency (Times/Week): 4, Weekly Exercise (Minutes/Week): 220, Intensity: Moderate   Goals Addressed             This Visit's Progress    Increase physical activity       Maintain  healthy lifestyle.       Depression Screen PHQ 2/9 Scores 08/11/2021 06/16/2020 08/15/2018 01/05/2013  PHQ - 2 Score 0 0 0 0  PHQ- 9 Score - - 3 -    Fall Risk Fall Risk  08/11/2021 01/05/2013  Falls in the past year? 0 No  Number falls in past yr: 0 -  Injury with Fall? 0 -    FALL RISK PREVENTION PERTAINING TO THE HOME:  Any stairs in or around the home? Yes  If so, are there any without handrails? No  Home free of loose throw rugs in walkways, pet beds, electrical cords, etc? Yes  Adequate lighting in your home to reduce risk of falls? Yes   ASSISTIVE DEVICES UTILIZED TO PREVENT FALLS:  Life alert? No  Use of a cane, walker or w/c? No  Grab bars in the bathroom? Yes Shower chair or bench in shower? Yes  Elevated toilet seat or a handicapped toilet? Yes   TIMED UP AND GO:  Was the test performed? No . Audio Visit 6CIT Screen 08/11/2021  What Year? 0 points  What time? 0 points  Count back from 20 0 points  Months in reverse 0 points  Repeat phrase 0 points    Immunizations Immunization History  Administered Date(s) Administered   Influenza-Unspecified 08/28/2019, 09/13/2020   Moderna SARS-COV2 Booster Vaccination 08/01/2020   Moderna Sars-Covid-2 Vaccination 10/17/2019, 11/14/2019   Pneumococcal Polysaccharide-23 07/09/2019   Td 09/27/2017      Flu Vaccine status: Declined, Education has been provided regarding the importance of this vaccine but patient still declined. Advised may receive this vaccine at local pharmacy or Health Dept. Aware to provide a copy of the vaccination record if obtained from local pharmacy or Health Dept. Verbalized acceptance and understanding.  Pneumococcal vaccine status: Declined,  Education has been provided regarding the importance of this vaccine but patient still declined. Advised may receive this vaccine at local pharmacy or Health Dept. Aware to provide a copy of the vaccination record if obtained from local pharmacy or  Health Dept. Verbalized acceptance and understanding.   Covid-19  vaccine status: Information provided on how to obtain vaccines.   Qualifies for Shingles Vaccine? Yes   Zostavax completed No   Shingrix Completed?: No.    Education has been provided regarding the importance of this vaccine. Patient has been advised to call insurance company to determine out of pocket expense if they have not yet received this vaccine. Advised may also receive vaccine at local pharmacy or Health Dept. Verbalized acceptance and understanding.  Screening Tests Health Maintenance  Topic Date Due   COVID-19 Vaccine (3 - Moderna risk series) 08/27/2021 (Originally 08/29/2020)   Zoster Vaccines- Shingrix (1 of 2) 11/11/2021 (Originally 09/30/1969)   INFLUENZA VACCINE  12/25/2021 (Originally 04/27/2021)   Pneumonia Vaccine 50+ Years old (2 - PCV) 08/11/2022 (Originally 07/08/2020)   MAMMOGRAM  02/05/2023   TETANUS/TDAP  09/28/2027   COLONOSCOPY (Pts 45-1yrs Insurance coverage will need to be confirmed)  07/30/2028   DEXA SCAN  Completed   Hepatitis C Screening  Addressed   HPV VACCINES  Aged Out    Health Maintenance  There are no preventive care reminders to display for this patient.   Vision Screening: Recommended annual ophthalmology exams for early detection of glaucoma and other disorders of the eye. Is the patient up to date with their annual eye exam?  Yes  Who is the provider or what is the name of the office in which the patient attends annual eye exams? Followed by Dr Clovis Fredrickson  Dental Screening: Recommended annual dental exams for proper oral hygiene  Community Resource Referral / Chronic Care Management:  CRR required this visit?  No   CCM required this visit?  No     Plan:     I have personally reviewed and noted the following in the patient's chart:   Medical and social history Use of alcohol, tobacco or illicit drugs  Current medications and supplements including opioid prescriptions.  Not currently taking opioids Functional ability and status Nutritional status Physical activity Advanced directives List of other physicians Hospitalizations, surgeries, and ER visits in previous 12 months Vitals Screenings to include cognitive, depression, and falls Referrals and appointments  In addition, I have reviewed and discussed with patient certain preventive protocols, quality metrics, and best practice recommendations. A written personalized care plan for preventive services as well as general preventive health recommendations were provided to patient.     Criselda Peaches, LPN   03/26/1600

## 2021-08-11 NOTE — Patient Instructions (Addendum)
Bonnie Moon , Thank you for taking time to come for your Medicare Wellness Visit. I appreciate your ongoing commitment to your health goals. Please review the following plan we discussed and let me know if I can assist you in the future.   These are the goals we discussed:  Goals      Increase physical activity     Maintain healthy lifestyle.        This is a list of the screening recommended for you and due dates:  Health Maintenance  Topic Date Due   COVID-19 Vaccine (3 - Moderna risk series) 08/27/2021*   Zoster (Shingles) Vaccine (1 of 2) 11/11/2021*   Flu Shot  12/25/2021*   Pneumonia Vaccine (2 - PCV) 08/11/2022*   Mammogram  02/05/2023   Tetanus Vaccine  09/28/2027   Colon Cancer Screening  07/30/2028   DEXA scan (bone density measurement)  Completed   Hepatitis C Screening: USPSTF Recommendation to screen - Ages 30-79 yo.  Addressed   HPV Vaccine  Aged Out  *Topic was postponed. The date shown is not the original due date.   Advanced directives: Yes  Conditions/risks identified: None  Next appointment: Follow up in one year for your annual wellness visit    Preventive Care 65 Years and Older, Female Preventive care refers to lifestyle choices and visits with your health care provider that can promote health and wellness. What does preventive care include? A yearly physical exam. This is also called an annual well check. Dental exams once or twice a year. Routine eye exams. Ask your health care provider how often you should have your eyes checked. Personal lifestyle choices, including: Daily care of your teeth and gums. Regular physical activity. Eating a healthy diet. Avoiding tobacco and drug use. Limiting alcohol use. Practicing safe sex. Taking low-dose aspirin every day. Taking vitamin and mineral supplements as recommended by your health care provider. What happens during an annual well check? The services and screenings done by your health care  provider during your annual well check will depend on your age, overall health, lifestyle risk factors, and family history of disease. Counseling  Your health care provider may ask you questions about your: Alcohol use. Tobacco use. Drug use. Emotional well-being. Home and relationship well-being. Sexual activity. Eating habits. History of falls. Memory and ability to understand (cognition). Work and work Statistician. Reproductive health. Screening  You may have the following tests or measurements: Height, weight, and BMI. Blood pressure. Lipid and cholesterol levels. These may be checked every 5 years, or more frequently if you are over 79 years old. Skin check. Lung cancer screening. You may have this screening every year starting at age 68 if you have a 30-pack-year history of smoking and currently smoke or have quit within the past 15 years. Fecal occult blood test (FOBT) of the stool. You may have this test every year starting at age 68. Flexible sigmoidoscopy or colonoscopy. You may have a sigmoidoscopy every 5 years or a colonoscopy every 10 years starting at age 68. Hepatitis C blood test. Hepatitis B blood test. Sexually transmitted disease (STD) testing. Diabetes screening. This is done by checking your blood sugar (glucose) after you have not eaten for a while (fasting). You may have this done every 68-3 years. Bone density scan. This is done to screen for osteoporosis. You may have this done starting at age 68. Mammogram. This may be done every 1-2 years. Talk to your health care provider about how often you should  have regular mammograms. Talk with your health care provider about your test results, treatment options, and if necessary, the need for more tests. Vaccines  Your health care provider may recommend certain vaccines, such as: Influenza vaccine. This is recommended every year. Tetanus, diphtheria, and acellular pertussis (Tdap, Td) vaccine. You may need a Td  booster every 10 years. Zoster vaccine. You may need this after age 68. Pneumococcal 13-valent conjugate (PCV13) vaccine. One dose is recommended after age 68. Pneumococcal polysaccharide (PPSV23) vaccine. One dose is recommended after age 68. Talk to your health care provider about which screenings and vaccines you need and how often you need them. This information is not intended to replace advice given to you by your health care provider. Make sure you discuss any questions you have with your health care provider. Document Released: 10/10/2015 Document Revised: 06/02/2016 Document Reviewed: 07/15/2015 Elsevier Interactive Patient Education  2017 [place] Prevention in the Home Falls can cause injuries. They can happen to people of all ages. There are many things you can do to make your home safe and to help prevent falls. What can I do on the outside of my home? Regularly fix the edges of walkways and driveways and fix any cracks. Remove anything that might make you trip as you walk through a door, such as a raised step or threshold. Trim any bushes or trees on the path to your home. Use bright outdoor lighting. Clear any walking paths of anything that might make someone trip, such as rocks or tools. Regularly check to see if handrails are loose or broken. Make sure that both sides of any steps have handrails. Any raised decks and porches should have guardrails on the edges. Have any leaves, snow, or ice cleared regularly. Use sand or salt on walking paths during winter. Clean up any spills in your garage right away. This includes oil or grease spills. What can I do in the bathroom? Use night lights. Install grab bars by the toilet and in the tub and shower. Do not use towel bars as grab bars. Use non-skid mats or decals in the tub or shower. If you need to sit down in the shower, use a plastic, non-slip stool. Keep the floor dry. Clean up any water that spills on the floor  as soon as it happens. Remove soap buildup in the tub or shower regularly. Attach bath mats securely with double-sided non-slip rug tape. Do not have throw rugs and other things on the floor that can make you trip. What can I do in the bedroom? Use night lights. Make sure that you have a light by your bed that is easy to reach. Do not use any sheets or blankets that are too big for your bed. They should not hang down onto the floor. Have a firm chair that has side arms. You can use this for support while you get dressed. Do not have throw rugs and other things on the floor that can make you trip. What can I do in the kitchen? Clean up any spills right away. Avoid walking on wet floors. Keep items that you use a lot in easy-to-reach places. If you need to reach something above you, use a strong step stool that has a grab bar. Keep electrical cords out of the way. Do not use floor polish or wax that makes floors slippery. If you must use wax, use non-skid floor wax. Do not have throw rugs and other things on the floor  that can make you trip. What can I do with my stairs? Do not leave any items on the stairs. Make sure that there are handrails on both sides of the stairs and use them. Fix handrails that are broken or loose. Make sure that handrails are as long as the stairways. Check any carpeting to make sure that it is firmly attached to the stairs. Fix any carpet that is loose or worn. Avoid having throw rugs at the top or bottom of the stairs. If you do have throw rugs, attach them to the floor with carpet tape. Make sure that you have a light switch at the top of the stairs and the bottom of the stairs. If you do not have them, ask someone to add them for you. What else can I do to help prevent falls? Wear shoes that: Do not have high heels. Have rubber bottoms. Are comfortable and fit you well. Are closed at the toe. Do not wear sandals. If you use a stepladder: Make sure that it is  fully opened. Do not climb a closed stepladder. Make sure that both sides of the stepladder are locked into place. Ask someone to hold it for you, if possible. Clearly mark and make sure that you can see: Any grab bars or handrails. First and last steps. Where the edge of each step is. Use tools that help you move around (mobility aids) if they are needed. These include: Canes. Walkers. Scooters. Crutches. Turn on the lights when you go into a dark area. Replace any light bulbs as soon as they burn out. Set up your furniture so you have a clear path. Avoid moving your furniture around. If any of your floors are uneven, fix them. If there are any pets around you, be aware of where they are. Review your medicines with your doctor. Some medicines can make you feel dizzy. This can increase your chance of falling. Ask your doctor what other things that you can do to help prevent falls. This information is not intended to replace advice given to you by your health care provider. Make sure you discuss any questions you have with your health care provider. Document Released: 07/10/2009 Document Revised: 02/19/2016 Document Reviewed: 10/18/2014 Elsevier Interactive Patient Education  2017 [employer].

## 2021-08-25 DIAGNOSIS — Z23 Encounter for immunization: Secondary | ICD-10-CM | POA: Diagnosis not present

## 2021-09-03 ENCOUNTER — Other Ambulatory Visit: Payer: Self-pay | Admitting: Family Medicine

## 2021-09-03 DIAGNOSIS — I10 Essential (primary) hypertension: Secondary | ICD-10-CM

## 2021-10-07 ENCOUNTER — Telehealth: Payer: Medicare Other

## 2021-10-12 ENCOUNTER — Telehealth: Payer: Self-pay | Admitting: Pharmacist

## 2021-10-12 NOTE — Chronic Care Management (AMB) (Signed)
Chronic Care Management Pharmacy Assistant   Name: Bonnie Moon  MRN: [id number] DOB: [birth year]  Bonnie Moon is an 71 y.o. year old female who presents for her initial CCM visit with the clinical pharmacist.  Reason for Encounter: Chart prep for initial visit with Jeni Salles the clinical pharmacist on 10/19/2021.   Conditions to be addressed/monitored: HTN, Anxiety, and Osteoporosis  Recent office visits:  08/11/2021 Rolene Arbour LPN - Medicare annual wellness exam  05/04/2021 Micheline Rough MD - Patient was seen for covid and an additional issue. Started Benzonatate 100 mg twice daily as needed and Guaifenesin 600 mg twice daily as needed. Discontinued Amlodipine.   Recent consult visits:  None  Hospital visits:  None  Medications: Outpatient Encounter Medications as of 10/12/2021  Medication Sig   ALPRAZolam (XANAX) 0.25 MG tablet 1 tab orally as needed for anxiety not to exceed more than two a day   Ascorbic Acid (VITAMIN C PO) Take 250 mg by mouth in the morning and at bedtime.   benzonatate (TESSALON) 100 MG capsule Take 1 capsule (100 mg total) by mouth 2 (two) times daily as needed for cough. (Patient not taking: No sig reported)   cetirizine (ZYRTEC ALLERGY) 10 MG tablet Take 1 tablet (10 mg total) by mouth daily.   Cholecalciferol (VITAMIN D-3) 1000 UNITS CAPS Take 2,000 Units by mouth.   guaiFENesin (MUCINEX) 600 MG 12 hr tablet Take 1 tablet (600 mg total) by mouth 2 (two) times daily as needed. (Patient not taking: No sig reported)   losartan-hydrochlorothiazide (HYZAAR) 100-12.5 MG tablet TAKE 1 TABLET BY MOUTH EVERY DAY   Multiple Vitamins-Minerals (MULTIVITAMIN PO) Take by mouth. gummies 2 daily   pravastatin (PRAVACHOL) 20 MG tablet TAKE 1 TABLET BY MOUTH EVERY DAY   No facility-administered encounter medications on file as of 10/12/2021.  Fill History:  alprazolam 0.25 mg tablet 06/16/2020 30   LOSARTAN-HCTZ 100-12.5 MG TAB 06/05/2021 90    PRAVASTATIN SODIUM 20 MG TAB 03/05/2021 90   Have you seen any other providers since your last visit? **Patient denies any visits with other providers recently.  Any changes in your medications or health? Patient denies any changes.   Any side effects from any medications? Patient denies any side effects.   Do you have an symptoms or problems not managed by your medications? Patient denies any issues not managed by her medications.   Any concerns about your health right now? Patient denies any other health concerns.   Has your provider asked that you check blood pressure, blood sugar, or follow special diet at home? Patient denies, she does state she is following a low carbohydrate and low sugar diet.   Do you get any type of exercise on a regular basis? Patient states she is walking 3 miles daily.  Can you think of a goal you would like to reach for your health? Patient states loosing weight.   Do you have any problems getting your medications? Patient denies any issue getting medications.   Is there anything that you would like to discuss during the appointment? Patient denies any issues to discuss.   Please bring medications and supplements to appointment   Care Gaps: AWV - completed on 08/11/2021 Last BP - 125/74 on 12/22/2020 Covid vaccine - overdue  Star Rating Drugs: Pravastatin 20 mg - [facility] with Abigail Butts Losartan HCTZ 100/12.5 mg - last filled 09/03/2021 90DS at [place]  Assistant [phone]

## 2021-10-14 ENCOUNTER — Telehealth: Payer: Self-pay | Admitting: Pharmacist

## 2021-10-14 NOTE — Chronic Care Management (AMB) (Signed)
° ° °  Chronic Care Management Pharmacy Assistant   Name: Bonnie Moon  MRN: [id number] DOB: 05-30-1951  10/19/2021 APPOINTMENT REMINDER  Called [name], No answer, left message of appointment on 10/19/2021 at 11:00 via telephone visit with Jeni Salles, Pharm D. Notified to have all medications, supplements, blood pressure and/or blood sugar logs available during appointment and to return call if need to reschedule.  Care Gaps: AWV - completed on 08/11/2021 Last BP - 125/74 on 12/22/2020 Covid vaccine - overdue  Star Rating Drug: Pravastatin 20 mg - [facility] with [place] [place] Losartan HCTZ 100/12.5 mg - [facility]  Any gaps in medications fill history? Yes  [place] Pharmacist Assistant [phone]

## 2021-10-18 NOTE — Progress Notes (Signed)
Chronic Care Management Pharmacy Note  10/19/2021 Name:  Bonnie Moon MRN:  [id number] DOB:  [birth year]  Summary: LDL not at goal < 100 BP not ideally at goal < 140/90  Recommendations/Changes made from today's visit: -Recommended moving pravastatin to morning or afternoon with other medications to improve adherence -Requested DEXA report from [facility] for 2022 -Recommended bringing BP cuff to next office visit -Recommend repeat lipid panel and consider Zetia or PCSK9 if LDL > 100  Plan: Scheduled CPE for March Follow up on BP and cholesterol in May   Subjective: Bonnie Moon is an 71 y.o. year old female who is a primary patient of Koberlein, Paris Lore, MD.  The CCM team was consulted for assistance with disease management and care coordination needs.    Engaged with patient by telephone for initial visit in response to provider referral for pharmacy case management and/or care coordination services.   Consent to Services:  The patient was given the following information about Chronic Care Management services today, agreed to services, and gave verbal consent: 1. CCM service includes personalized support from designated clinical staff supervised by the primary care provider, including individualized plan of care and coordination with other care providers 2. 24/7 contact phone numbers for assistance for urgent and routine care needs. 3. Service will only be billed when office clinical staff spend 20 minutes or more in a month to coordinate care. 4. Only one practitioner may furnish and bill the service in a calendar month. 5.The patient may stop CCM services at any time (effective at the end of the month) by phone call to the office staff. 6. The patient will be responsible for cost sharing (co-pay) of up to 20% of the service fee (after annual deductible is met). Patient agreed to services and consent obtained.  Patient Care Team: Wynn Banker, MD as PCP - General (Family  Medicine) Jerene Bears, MD as Attending Physician (Obstetrics and Gynecology) Meryl Dare, MD as Attending Physician (Gastroenterology) Doristine Locks, MD (Internal Medicine) Doristine Locks, MD (Internal Medicine) Verner Chol, [facility] as Pharmacist (Pharmacist)  Recent office visits: 08/11/2021 Theresa Mulligan LPN - Medicare annual wellness exam   05/04/2021 Theodis Shove MD - Patient was seen for covid and an additional issue. Started Benzonatate 100 mg twice daily as needed and Guaifenesin 600 mg twice daily as needed. Discontinued Amlodipine.   Recent consult visits: 07/30/21 Patient presented for colonoscopy.  Hospital visits: None in previous 6 months   Objective:  Lab Results  Component Value Date   CREATININE 0.78 02/28/2019   BUN 15 02/28/2019   GFR 73.80 02/28/2019   NA 140 02/28/2019   K 4.2 02/28/2019   CALCIUM 9.8 02/28/2019   CALCIUM 9.7 02/28/2019   CO2 29 02/28/2019   GLUCOSE 83 02/28/2019    Lab Results  Component Value Date/Time   HGBA1C 5.4 01/05/2013 10:10 AM   GFR 73.80 02/28/2019 09:49 AM   GFR 85.01 01/05/2013 10:10 AM    Last diabetic Eye exam: No results found for: HMDIABEYEEXA  Last diabetic Foot exam: No results found for: HMDIABFOOTEX   Lab Results  Component Value Date   CHOL 269 (H) 02/28/2019   HDL 79.70 02/28/2019   LDLCALC 173 (H) 02/28/2019   LDLDIRECT 179.1 01/05/2013   TRIG 81.0 02/28/2019   CHOLHDL 3 02/28/2019    Hepatic Function Latest Ref Rng & Units 02/28/2019 12/08/2015 10/08/2014  Total Protein 6.0 - 8.3 g/dL 7.1 7.0 6.8  Albumin 3.5 -  5.2 g/dL 4.4 4.2 4.0  AST 0 - 37 U/L $Remo'17 19 20  'rOHkj$ ALT 0 - 35 U/L $Remo'10 12 14  'pjShC$ Alk Phosphatase 39 - 117 U/L 95 129 121(H)  Total Bilirubin 0.2 - 1.2 mg/dL 0.9 0.7 0.5    Lab Results  Component Value Date/Time   TSH 0.42 02/28/2019 09:49 AM   TSH 0.43 12/08/2015 09:18 AM   FREET4 1.08 02/28/2019 09:49 AM   FREET4 0.81 01/05/2013 10:10 AM    CBC Latest Ref Rng & Units 02/28/2019  12/08/2015 10/08/2014  WBC 4.0 - 10.5 K/uL 5.3 5.6 6.1  Hemoglobin 12.0 - 15.0 g/dL 14.2 13.8 13.7  Hematocrit 36.0 - 46.0 % 41.5 41.4 41.5  Platelets 150.0 - 400.0 K/uL 273.0 267 255    Lab Results  Component Value Date/Time   VD25OH 65.63 02/28/2019 09:49 AM   VD25OH 30 12/08/2015 09:18 AM   VD25OH 30 10/08/2014 08:57 AM    Clinical ASCVD: No  The ASCVD Risk score (Arnett DK, et al., 2019) failed to calculate for the following reasons:   The systolic blood pressure is missing    Depression screen [facility] 2/9 08/11/2021 06/16/2020 08/15/2018  Decreased Interest 0 0 -  Down, Depressed, Hopeless 0 0 0  PHQ - 2 Score 0 0 0  Altered sleeping - - 1  Tired, decreased energy - - 1  Change in appetite - - 0  Feeling bad or failure about yourself  - - 0  Trouble concentrating - - 0  Moving slowly or fidgety/restless - - 1  Suicidal thoughts - - 0  PHQ-9 Score - - 3     Social History   Tobacco Use  Smoking Status Never  Smokeless Tobacco Never   BP Readings from Last 3 Encounters:  07/30/21 131/71  12/22/20 125/74  12/22/20 (!) 140/100   Pulse Readings from Last 3 Encounters:  07/30/21 60  12/22/20 (!) 56  12/22/20 (!) 49   Wt Readings from Last 3 Encounters:  08/11/21 142 lb (64.4 kg)  07/30/21 144 lb (65.3 kg)  05/07/21 144 lb (65.3 kg)   BMI Readings from Last 3 Encounters:  08/11/21 25.97 kg/m  07/30/21 25.51 kg/m  05/07/21 25.51 kg/m    Assessment/Interventions: Review of patient past medical history, allergies, medications, health status, including review of consultants reports, laboratory and other test data, was performed as part of comprehensive evaluation and provision of chronic care management services.   SDOH:  (Social Determinants of Health) assessments and interventions performed: Yes SDOH Interventions    Flowsheet Row Most Recent Value  SDOH Interventions   Financial Strain Interventions Intervention Not Indicated  Transportation Interventions  Intervention Not Indicated      SDOH Screenings   Alcohol Screen: Not on file  Depression (PHQ2-9): Low Risk    PHQ-2 Score: 0  Financial Resource Strain: Low Risk    Difficulty of Paying Living Expenses: Not hard at all  Food Insecurity: No Food Insecurity   Worried About Charity fundraiser in the Last Year: Never true   [place] in the Last Year: Never true  Housing: Low Risk    Last Housing Risk Score: 0  Physical Activity: Sufficiently Active   Days of Exercise per Week: 4 days   Minutes of Exercise per Session: 50 min  Social Connections: Moderately Integrated   Frequency of Communication with Friends and Family: More than three times a week   Frequency of Social Gatherings with Friends and Family: More than  three times a week   Attends Religious Services: More than 4 times per year   Active Member of Clubs or Organizations: Yes   Attends Archivist Meetings: More than 4 times per year   Marital Status: Widowed  Stress: No Stress Concern Present   Feeling of Stress : Not at all  Tobacco Use: Low Risk    Smoking Tobacco Use: Never   Smokeless Tobacco Use: Never   Passive Exposure: Not on file  Transportation Needs: No Transportation Needs   Lack of Transportation (Medical): No   Lack of Transportation (Non-Medical): No   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 She usually gets up around 7 am and then has a light breakfast. She starts to work around 8 and does her house work. She has to help with her parents and her dad has alzheimers and mom has heart failure and they live close by. She has 2 other siblings that also help with her parents.  Patient walks 2-3 miles a day almost every afternoon. Patient is also trying to follow a low carb diet and watches her sodium intake. She usually eats a light breakfast and sometimes skips it and then is trying to eat a larger meal for lunch and then a smaller  dinner.  Patient is pretty flexible with work and probably works about 3-4 days a week. She is in and out with work throughout the day and still enjoys doing it.  Patient reports she sleeps off and on and some nights she wakes up 2-3 times and sometimes to go to the bathroom. Patient reports she usually sleeps about 6-8 hours and does not feel tired during the day and doesn't take naps.  Patient denies any issues with current medications other than she thinks pravastatin may increase her urinary frequency at night. Patient also could not tolerate amlodipine as this made her ankles swollen but she is no longer taking this.  Patient reports her biggest concerns are with her thyroid health and her cholesterol. She sees an endocrinologist yearly for her parathyroid but this is an ongoing problem that also runs in the family. Her daughter had thyroid cancer and she takes this very seriously.    CCM Care Plan  Allergies  Allergen Reactions   Lipitor [Atorvastatin Calcium]     Medications Reviewed Today     Reviewed by Viona Gilmore, [facility] (Pharmacist) on 10/19/21 at 1250  Med List Status: <None>   Medication Order Taking? Sig Documenting Provider Last Dose Status Informant  ALPRAZolam (XANAX) 0.25 MG tablet [id number] Yes 1 tab orally as needed for anxiety not to exceed more than two a day Koberlein, Junell C, MD Taking Active   Ascorbic Acid (VITAMIN C PO) [id number] Yes Take 250 mg by mouth daily. [provider] Taking Active   cetirizine (ZYRTEC ALLERGY) 10 MG tablet [id number] Yes Take 1 tablet (10 mg total) by mouth daily. Caren Macadam, MD Taking Active   Cholecalciferol (VITAMIN D-3) 1000 UNITS CAPS [id number] Yes Take 2,000 Units by mouth. [provider] Taking Active   famotidine (PEPCID) 20 MG tablet [id number] Yes Take 20 mg by mouth as needed for heartburn or indigestion. [provider] Taking Active   losartan-hydrochlorothiazide (HYZAAR) 100-12.5  MG tablet [id number] Yes TAKE 1 TABLET BY MOUTH EVERY DAY Koberlein, Steele Berg, MD Taking Active   Multiple Vitamins-Minerals (MULTIVITAMIN PO) [id number] Yes Take by mouth. gummies 2 daily [provider] Taking Active  pravastatin (PRAVACHOL) 20 MG tablet [id number] Yes TAKE 1 TABLET BY MOUTH EVERY DAY Caren Macadam, MD Taking Active   Turmeric-Ginger 150-25 MG CHEW [id number] Yes Chew 1 tablet by mouth daily. 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   Hyperparathyroidism, primary ([place]) 11/16/2016   Vitamin D deficiency 01/26/2013   Allergic rhinitis 01/26/2013   Hyperlipidemia 01/26/2013   S/P hip replacement 01/17/2012    Immunization History  Administered Date(s) Administered   Influenza-Unspecified 08/28/2019, 09/13/2020   Moderna SARS-COV2 Booster Vaccination 08/01/2020   Moderna Sars-Covid-2 Vaccination 10/17/2019, 11/14/2019   Pneumococcal Polysaccharide-23 07/09/2019   Td 09/27/2017    Conditions to be addressed/monitored:  Hypertension, Hyperlipidemia, Anxiety, Osteoporosis, and Allergic Rhinitis  Care Plan : [place]  Updates made by Viona Gilmore, [place] since 10/19/2021 12:00 AM     Problem: Problem: Hypertension, Hyperlipidemia, Anxiety, Osteoporosis, Allergic Rhinitis, and Vitamin D deficiency      Long-Range Goal: Patient-Specific Goal   Start Date: 10/19/2021  Expected End Date: 10/19/2022  This Visit's Progress: On track  Priority: High  Note:   Current Barriers:  Unable to independently monitor therapeutic efficacy Unable to achieve control of cholesterol   Pharmacist Clinical Goal(s):  Patient will achieve adherence to monitoring guidelines and medication adherence  to achieve therapeutic efficacy achieve control of cholesterol as evidenced by next lipid panel  through collaboration with PharmD and provider.   Interventions: 1:1 collaboration with Caren Macadam, MD regarding development and update of comprehensive plan of care as evidenced by provider attestation and co-signature Inter-disciplinary care team collaboration (see longitudinal plan of care) Comprehensive medication review performed; medication list updated in electronic medical record  Hypertension (BP goal <140/90) -Not ideally controlled -Current treatment: Losartan-HCTZ 100-12.5 mg 1 tablet daily - in AM - Appropriate, Query effective, Safe, Accessible -Medications previously tried: amlodipine (ankle swelling)  -Current home readings: 140s (checks BP sometimes, arm cuff)  -Current dietary habits: does limits salt intake; doesn't add extra salt and looks at sodium content on package labels -Current exercise habits: walking almost daily -Denies hypotensive/hypertensive symptoms -Educated on BP goals and benefits of medications for prevention of heart attack, stroke and kidney damage; Importance of home blood pressure monitoring; Proper BP monitoring technique; -Counseled to monitor BP at home weekly, document, and provide log at future appointments -Counseled on diet and exercise extensively Recommended to continue current medication Recommended bringing BP cuff to next office visit.  Hyperlipidemia: (LDL goal < 100) -Uncontrolled -Current treatment: Pravastatin 20 mg 1 tablet daily  - Appropriate, Query effective, Safe, Accessible -Medications previously tried: atorvastatin, rosuvastatin (muscle aches)  -Current dietary patterns: no fried foods; uses olive oil; working on adding more fruits and vegetables -Current exercise habits: walking almost daily -Educated on Cholesterol goals;  Benefits of statin for ASCVD risk reduction; Importance of limiting foods high in  cholesterol; -Counseled on diet and exercise extensively Recommended to continue current medication Counseled on taking pravastatin in afternoon or morning daily to improve adherence.  Anxiety (Goal: minimize symptoms) -Controlled -Current treatment: Alprazolam 0.25 mg 1 tablet twice daily as needed - Appropriate, Effective, Safe, Accessible -Medications previously tried/failed: none -PHQ9: n/a -GAD7: n/a -Educated on Benefits of medication for symptom control -Recommended  to continue current medication  Osteoporosis (Goal prevent fractures) -Uncontrolled -Last DEXA Scan: 08/16/18   T-Score femoral neck: -2.8  T-Score total hip: n/a  T-Score lumbar spine: -2.2  T-Score forearm radius: n/a  10-year probability of major osteoporotic fracture: n/a  10-year probability of hip fracture: n/a -Patient is a candidate for pharmacologic treatment due to T-Score < -2.5 in femoral neck -Current treatment  Vitamin D 2000 units 1 capsule daily - Appropriate, Effective, Safe, Accessible Multivitamin 1 tablet daily (calcium 8 mg)  -Medications previously tried: calcium (recurrent kidney stones) -Recommend [phone] units of vitamin D daily. Recommend 1200 mg of calcium daily from dietary and supplemental sources. Recommend weight-bearing and muscle strengthening exercises for building and maintaining bone density. -Counseled on diet and exercise extensively Collaborated with PCP to obtain DEXA report from [facility] for 2022.  Allergic rhinitis (Goal: minimize symptoms) -Controlled -Current treatment  Cetirizine 10 mg 1 tablet daily - Appropriate, Query effective, Safe, Accessible -Medications previously tried: Flonase (doesn't want to use) -Recommended as needed saline nasal spray to improve congestion.  Health Maintenance -Vaccine gaps: COVID booster (declines further vaccines), shingrix, Prevnar -Current therapy:  Vitamin C 250 mg 1 tablet at breakfast  Famotidine 20 mg as needed for  heartburn -Educated on Cost vs benefit of each product must be carefully weighed by individual consumer -Patient is satisfied with current therapy and denies issues -Recommended to continue current medication  Patient Goals/Self-Care Activities Patient will:  - focus on medication adherence by moving check blood pressure weekly, document, and provide at future appointments target a minimum of 150 minutes of moderate intensity exercise weekly  Follow Up Plan: Telephone follow up appointment with care management team member scheduled for:4 months       Medication Assistance: None required.  Patient affirms current coverage meets needs.  Compliance/Adherence/Medication fill history: Care Gaps: COVID booster, shingrix, Prevnar Last BP - 125/74 on 12/22/2020  Star-Rating Drugs: Pravastatin 20 mg - [facility] Abigail Butts Losartan HCTZ 100/12.5 mg - [facility]  [facility]  [id number] [place], [address] [place] [address] Phone: [phone] Fax: [phone]  Uses pill box? Yes  Pt endorses 80% compliance - misses pravastatin often  We discussed: Current pharmacy is preferred with insurance plan and patient is satisfied with pharmacy services Patient decided to: Continue current medication management strategy  Care Plan and Follow Up Patient Decision:  Patient agrees to Care Plan and Follow-up.  Plan: Telephone follow up appointment with care management team member scheduled for:  May 2023  Jeni Salles, PharmD, [place] Pharmacist [place] at [place] [phone]

## 2021-10-19 ENCOUNTER — Ambulatory Visit (INDEPENDENT_AMBULATORY_CARE_PROVIDER_SITE_OTHER): Payer: Medicare Other | Admitting: Pharmacist

## 2021-10-19 DIAGNOSIS — I10 Essential (primary) hypertension: Secondary | ICD-10-CM

## 2021-10-19 DIAGNOSIS — E785 Hyperlipidemia, unspecified: Secondary | ICD-10-CM

## 2021-10-19 NOTE — Patient Instructions (Signed)
Hi Taiana,  It was great to get to meet you over the telephone! Below is a summary of some of the topics we discussed. Don't forget to bring your blood pressure cuff to your office visit with Dr. Ethlyn Gallery in March.  Please reach out to me if you have any questions or need anything before our follow up!  Best, Maddie  Jeni Salles, PharmD, [facility]   Visit Information   Goals Addressed             This Visit's Progress    Track and Manage My Blood Pressure-Hypertension       Timeframe:  Long-Range Goal Priority:  Medium Start Date:                             Expected End Date:                       Follow Up Date 01/25/22    - check blood pressure weekly - choose a place to take my blood pressure (home, clinic or office, retail store) - write blood pressure results in a log or diary    Why is this important?   You won't feel high blood pressure, but it can still hurt your blood vessels.  High blood pressure can cause heart or kidney problems. It can also cause a stroke.  Making lifestyle changes like losing a little weight or eating less salt will help.  Checking your blood pressure at home and at different times of the day can help to control blood pressure.  If the doctor prescribes medicine remember to take it the way the doctor ordered.  Call the office if you cannot afford the medicine or if there are questions about it.     Notes:        Patient Care Plan: CCM Pharmacy Care Plan     Problem Identified: Problem: Hypertension, Hyperlipidemia, Anxiety, Osteoporosis, Allergic Rhinitis, and Vitamin D deficiency      Long-Range Goal: Patient-Specific Goal   Start Date: 10/19/2021  Expected End Date: 10/19/2022  This Visit's Progress: On track  Priority: High  Note:   Current Barriers:  Unable to independently monitor therapeutic efficacy Unable to achieve control of cholesterol   Pharmacist  Clinical Goal(s):  Patient will achieve adherence to monitoring guidelines and medication adherence to achieve therapeutic efficacy achieve control of cholesterol as evidenced by next lipid panel  through collaboration with PharmD and provider.   Interventions: 1:1 collaboration with Caren Macadam, MD regarding development and update of comprehensive plan of care as evidenced by provider attestation and co-signature Inter-disciplinary care team collaboration (see longitudinal plan of care) Comprehensive medication review performed; medication list updated in electronic medical record  Hypertension (BP goal <140/90) -Not ideally controlled -Current treatment: Losartan-HCTZ 100-12.5 mg 1 tablet daily - in AM - Appropriate, Query effective, Safe, Accessible -Medications previously tried: amlodipine (ankle swelling)  -Current home readings: 140s (checks BP sometimes, arm cuff)  -Current dietary habits: does limits salt intake; doesn't add extra salt and looks at sodium content on package labels -Current exercise habits: walking almost daily -Denies hypotensive/hypertensive symptoms -Educated on BP goals and benefits of medications for prevention of heart attack, stroke and kidney damage; Importance of home blood pressure monitoring; Proper BP monitoring technique; -Counseled to monitor BP at home weekly, document, and provide log at future appointments -Counseled on diet and  exercise extensively Recommended to continue current medication Recommended bringing BP cuff to next office visit.  Hyperlipidemia: (LDL goal < 100) -Uncontrolled -Current treatment: Pravastatin 20 mg 1 tablet daily  - Appropriate, Query effective, Safe, Accessible -Medications previously tried: atorvastatin, rosuvastatin (muscle aches)  -Current dietary patterns: no fried foods; uses olive oil; working on adding more fruits and vegetables -Current exercise habits: walking almost daily -Educated on Cholesterol  goals;  Benefits of statin for ASCVD risk reduction; Importance of limiting foods high in cholesterol; -Counseled on diet and exercise extensively Recommended to continue current medication Counseled on taking pravastatin in afternoon or morning daily to improve adherence.  Anxiety (Goal: minimize symptoms) -Controlled -Current treatment: Alprazolam 0.25 mg 1 tablet twice daily as needed - Appropriate, Effective, Safe, Accessible -Medications previously tried/failed: none -PHQ9: n/a -GAD7: n/a -Educated on Benefits of medication for symptom control -Recommended to continue current medication  Osteoporosis (Goal prevent fractures) -Uncontrolled -Last DEXA Scan: 08/16/18   T-Score femoral neck: -2.8  T-Score total hip: n/a  T-Score lumbar spine: -2.2  T-Score forearm radius: n/a  10-year probability of major osteoporotic fracture: n/a  10-year probability of hip fracture: n/a -Patient is a candidate for pharmacologic treatment due to T-Score < -2.5 in femoral neck -Current treatment  Vitamin D 2000 units 1 capsule daily - Appropriate, Effective, Safe, Accessible Multivitamin 1 tablet daily (calcium 8 mg)  -Medications previously tried: calcium (recurrent kidney stones) -Recommend [phone] units of vitamin D daily. Recommend 1200 mg of calcium daily from dietary and supplemental sources. Recommend weight-bearing and muscle strengthening exercises for building and maintaining bone density. -Counseled on diet and exercise extensively Collaborated with PCP to obtain D[facility] report [facility] for 2022.  Allergic rhinitis (Goal: minimize symptoms) -Controlled -Current treatment  Cetirizine 10 mg 1 tablet daily - Appropriate, Query effective, Safe, Accessible -Medications previously tried: Flonase (doesn't want to use) -Recommended as needed saline nasal spray to improve congestion.  Health Maintenance -Vaccine gaps: COVID booster (declines further vaccines), shingrix,  Prevnar -Current therapy:  Vitamin C 250 mg 1 tablet at breakfast  Famotidine 20 mg as needed for heartburn -Educated on Cost vs benefit of each product must be carefully weighed by individual consumer -Patient is satisfied with current therapy and denies issues -Recommended to continue current medication  Patient Goals/Self-Care Activities Patient will:  - focus on medication adherence by moving check blood pressure weekly, document, and provide at future appointments target a minimum of 150 minutes of moderate intensity exercise weekly  Follow Up Plan: Telephone follow up appointment with care management team member scheduled for:4 months      Ms. Tripp was given information about Chronic Care Management services today including:  CCM service includes personalized support from designated clinical staff supervised by her physician, including individualized plan of care and coordination with other care providers 24/7 contact phone numbers for assistance for urgent and routine care needs. Standard insurance, coinsurance, copays and deductibles apply for chronic care management only during months in which we provide at least 20 minutes of these services. Most insurances cover these services at 100%, however patients may be responsible for any copay, coinsurance and/or deductible if applicable. This service may help you avoid the need for more expensive face-to-face services. Only one practitioner may furnish and bill the service in a calendar month. The patient may stop CCM services at any time (effective at the end of the month) by phone call to the office staff.  Patient agreed to services and verbal consent obtained.  The patient verbalized understanding of instructions, educational materials, and care plan provided today and agreed to receive a mailed copy of patient instructions, educational materials, and care plan.  Telephone follow up appointment with pharmacy team member  scheduled for: May 2023  Viona Gilmore, [facility]

## 2021-10-27 DIAGNOSIS — E785 Hyperlipidemia, unspecified: Secondary | ICD-10-CM

## 2021-10-27 DIAGNOSIS — I10 Essential (primary) hypertension: Secondary | ICD-10-CM | POA: Diagnosis not present

## 2021-10-27 DIAGNOSIS — M810 Age-related osteoporosis without current pathological fracture: Secondary | ICD-10-CM | POA: Diagnosis not present

## 2021-11-30 ENCOUNTER — Encounter: Payer: Self-pay | Admitting: Family Medicine

## 2021-11-30 ENCOUNTER — Ambulatory Visit (INDEPENDENT_AMBULATORY_CARE_PROVIDER_SITE_OTHER): Payer: Medicare Other | Admitting: Family Medicine

## 2021-11-30 ENCOUNTER — Other Ambulatory Visit: Payer: Self-pay

## 2021-11-30 VITALS — BP 138/80 | HR 58 | Temp 98.0°F | Ht 63.0 in | Wt 147.6 lb

## 2021-11-30 DIAGNOSIS — E892 Postprocedural hypoparathyroidism: Secondary | ICD-10-CM | POA: Diagnosis not present

## 2021-11-30 DIAGNOSIS — I10 Essential (primary) hypertension: Secondary | ICD-10-CM

## 2021-11-30 DIAGNOSIS — E785 Hyperlipidemia, unspecified: Secondary | ICD-10-CM

## 2021-11-30 DIAGNOSIS — M810 Age-related osteoporosis without current pathological fracture: Secondary | ICD-10-CM | POA: Diagnosis not present

## 2021-11-30 DIAGNOSIS — J309 Allergic rhinitis, unspecified: Secondary | ICD-10-CM

## 2021-11-30 DIAGNOSIS — Z9889 Other specified postprocedural states: Secondary | ICD-10-CM

## 2021-11-30 DIAGNOSIS — R635 Abnormal weight gain: Secondary | ICD-10-CM | POA: Diagnosis not present

## 2021-11-30 MED ORDER — LOSARTAN POTASSIUM-HCTZ 100-25 MG PO TABS: 1.0000 | ORAL_TABLET | Freq: Every day | ORAL | 1 refills | Status: DC

## 2021-11-30 NOTE — Progress Notes (Signed)
Bonnie Moon DOB: 27-Jan-1951 Encounter date: 11/30/2021  This is a 71 y.o. female who presents for chronic condition follow up  History of present illness/Additional concerns: Worried she can't lose weight. She is walking 3-4 times/week. Holding weight at core. Eating healthier. Would like to be 136-138.   Last visit with me was 05/04/2021 and was virtual.  That was for acute sick visit.  Prior to that, last visit was 12/22/2020.  Seasonal allergies: Zyrtec  Hypertension: Losartan-hydrochlorothiazide 100-12.5 mg daily.  Hyperlipidemia: Per pharmacy, patient has not been compliant with her pravastatin 20 mg daily. Taking pravastsatin daily. Did go off for a bit. Takes in afternoon.  Osteoporosis: Follows with endocrinology.  Taking vitamin D 2000 units daily.?DEXA - states was in 01/2021. saw them last one year ago. Last bloodwork report was good. They even check cholesterol. She sees them next month.   Colonoscopy completed last year and due for repeat 07/30/2028  She follows with Dr. Sabra Heck for gynecology needs.  Mammograms are done through [facility].  Last one was done 01/2021.  This was normal.  GERD: Pepcid 20 mg daily as needed.  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  Follows with Dr. Shawna Orleans in [place].  Past Medical History:  Diagnosis Date   Allergy    GERD (gastroesophageal reflux disease)    Hyperlipidemia    IBS (irritable bowel syndrome)    Nephrolithiasis    Osteopenia    Thyroid nodule    Tubular adenoma of colon 02/2009   Vitamin D insufficiency    Past Surgical History:  Procedure Laterality Date   BREAST ENHANCEMENT SURGERY     COLONOSCOPY  04/11/2014   Dr.Stark   PARATHYROIDECTOMY  01/17/2017   done by Dr. Oran Rein- at [facility] in [place]  (1/2 parathyroid remains)   TONSILLECTOMY     TOTAL HIP ARTHROPLASTY Left 05/2008   TOTAL HIP ARTHROPLASTY Right 04/02/2020   Dr Reynolds Bowl patient   Allergies  Allergen Reactions   Lipitor [Atorvastatin  Calcium]    Current Meds  Medication Sig   ALPRAZolam (XANAX) 0.25 MG tablet 1 tab orally as needed for anxiety not to exceed more than two a day   Ascorbic Acid (VITAMIN C PO) Take 250 mg by mouth daily.   cetirizine (ZYRTEC ALLERGY) 10 MG tablet Take 1 tablet (10 mg total) by mouth daily.   Cholecalciferol (VITAMIN D-3) 1000 UNITS CAPS Take 2,000 Units by mouth.   famotidine (PEPCID) 20 MG tablet Take 20 mg by mouth as needed for heartburn or indigestion.   losartan-hydrochlorothiazide (HYZAAR) 100-25 MG tablet Take 1 tablet by mouth daily.   Multiple Vitamins-Minerals (MULTIVITAMIN PO) Take by mouth. gummies 2 daily   pravastatin (PRAVACHOL) 20 MG tablet TAKE 1 TABLET BY MOUTH EVERY DAY   Turmeric-Ginger 150-25 MG CHEW Chew 1 tablet by mouth daily.   [DISCONTINUED] losartan-hydrochlorothiazide (HYZAAR) 100-12.5 MG tablet TAKE 1 TABLET BY MOUTH EVERY DAY   Social History   Tobacco Use   Smoking status: Never   Smokeless tobacco: Never  Substance Use Topics   Alcohol use: Not Currently    Comment: one drink every 2-3 weeks   Family History  Problem Relation Age of Onset   Osteopenia Mother    Heart disease Mother        a. fib   High Cholesterol Mother    Heart failure Mother        pacemaker   Other Mother  parathyroid disease   Atrial fibrillation Father    Alzheimer's disease Father    Other Father        pacemaker   Other Sister        parathyroid   Thyroid nodules Brother    Thyroid disease Daughter        nodule, cancer   High Cholesterol Daughter    Colon cancer Neg Hx    Pancreatic cancer Neg Hx    Rectal cancer Neg Hx    Stomach cancer Neg Hx    Esophageal cancer Neg Hx    Colon polyps Neg Hx      Review of Systems  Constitutional:  Negative for activity change, appetite change, chills, fatigue, fever and unexpected weight change.  HENT:  Negative for congestion, ear pain, hearing loss, sinus pressure, sinus pain, sore throat and trouble  swallowing.   Eyes:  Negative for pain and visual disturbance.  Respiratory:  Negative for cough, chest tightness, shortness of breath and wheezing.   Cardiovascular:  Negative for chest pain, palpitations and leg swelling.  Gastrointestinal:  Negative for abdominal pain, blood in stool, constipation, diarrhea, nausea and vomiting.  Genitourinary:  Negative for difficulty urinating and menstrual problem.  Musculoskeletal:  Positive for arthralgias (knees). Negative for back pain.  Skin:  Negative for rash.  Neurological:  Negative for dizziness, weakness, numbness and headaches.  Hematological:  Negative for adenopathy. Does not bruise/bleed easily.  Psychiatric/Behavioral:  Negative for sleep disturbance and suicidal ideas. The patient is not nervous/anxious.    CBC:  Lab Results  Component Value Date   WBC 5.3 02/28/2019   HGB 14.2 02/28/2019   HCT 41.5 02/28/2019   MCH 30.5 12/08/2015   MCHC 34.1 02/28/2019   RDW 14.1 02/28/2019   PLT 273.0 02/28/2019   MPV 9.2 12/08/2015   CMP: Lab Results  Component Value Date   NA 140 02/28/2019   K 4.2 02/28/2019   CL 102 02/28/2019   CO2 29 02/28/2019   GLUCOSE 83 02/28/2019   BUN 15 02/28/2019   CREATININE 0.78 02/28/2019   CREATININE 0.62 12/08/2015   CALCIUM 9.8 02/28/2019   CALCIUM 9.7 02/28/2019   PROT 7.1 02/28/2019   BILITOT 0.9 02/28/2019   ALKPHOS 95 02/28/2019   ALT 10 02/28/2019   AST 17 02/28/2019   LIPID: Lab Results  Component Value Date   CHOL 269 (H) 02/28/2019   TRIG 81.0 02/28/2019   HDL 79.70 02/28/2019   LDLCALC 173 (H) 02/28/2019    Objective:  BP 138/80    Pulse (!) 58    Temp 98 F (36.7 C) (Oral)    Ht '5\' 3"'$  (1.6 m)    Wt 147 lb 9.6 oz (67 kg)    LMP 07/28/2002    SpO2 99%    BMI 26.15 kg/m   Weight: 147 lb 9.6 oz (67 kg)   BP Readings from Last 3 Encounters:  11/30/21 138/80  07/30/21 131/71  12/22/20 125/74   Wt Readings from Last 3 Encounters:  11/30/21 147 lb 9.6 oz (67 kg)  08/11/21  142 lb (64.4 kg)  07/30/21 144 lb (65.3 kg)    Physical Exam Constitutional:      General: She is not in acute distress.    Appearance: She is well-developed.  HENT:     Head: Normocephalic and atraumatic.     Right Ear: External ear normal.     Left Ear: External ear normal.     Mouth/Throat:  Pharynx: No oropharyngeal exudate.  Eyes:     Conjunctiva/sclera: Conjunctivae normal.     Pupils: Pupils are equal, round, and reactive to light.  Neck:     Thyroid: No thyromegaly.  Cardiovascular:     Rate and Rhythm: Normal rate and regular rhythm.     Heart sounds: Normal heart sounds. No murmur heard.   No friction rub. No gallop.  Pulmonary:     Effort: Pulmonary effort is normal.     Breath sounds: Normal breath sounds.  Abdominal:     General: Bowel sounds are normal. There is no distension.     Palpations: Abdomen is soft. There is no mass.     Tenderness: There is no abdominal tenderness. There is no guarding.     Hernia: No hernia is present.  Musculoskeletal:        General: No tenderness or deformity. Normal range of motion.     Cervical back: Normal range of motion and neck supple.  Lymphadenopathy:     Cervical: No cervical adenopathy.  Skin:    General: Skin is warm and dry.     Findings: No rash.  Neurological:     Mental Status: She is alert and oriented to person, place, and time.     Deep Tendon Reflexes: Reflexes normal.     Reflex Scores:      Tricep reflexes are 2+ on the right side and 2+ on the left side.      Bicep reflexes are 2+ on the right side and 2+ on the left side.      Brachioradialis reflexes are 2+ on the right side and 2+ on the left side.      Patellar reflexes are 2+ on the right side and 2+ on the left side. Psychiatric:        Speech: Speech normal.        Behavior: Behavior normal.        Thought Content: Thought content normal.    Assessment/Plan: There are no preventive care reminders to display for this patient.  Health  Maintenance reviewed.   1. Primary hypertension Blood pressure is always higher in the doctor's office.  Numbers at home are much better, however recheck today is slightly better.  Continue with losartan hydrochlorothiazide combination.  Because none of her numbers at home are too low, I am going to increase the hydrochlorothiazide component in her combination medication to 25 mg (from 12.5)  2. Allergic rhinitis, unspecified seasonality, unspecified trigger Continue with Zyrtec as needed.  3. Age-related osteoporosis without current pathological fracture We discussed benefits of treatment today.  She is on board with improving bone density to prevent fractures.  She will discuss this with endocrinology.  Unfortunately, I do not have her bone density today, but we have requested this.  Once I get a copy I will let her know and we can forward to specialist as requested.  This visit note.  Bone density has slightly worsened in the spine T score -2.5 with prior -2.3.  Hips excluded due to prior surgery.  Radial density -5 on the left and -3.7 on the right.  No prior radial comparison.  4. Hyperlipidemia, unspecified hyperlipidemia type She took a break from taking pravastatin, but is back taking it regularly.  She completes blood work with her endocrinologist and has appointment coming up.  She will get me a copy of this lab work.  5. S/P parathyroidectomy ([place]) Follows regularly with endocrinology.  6. Weight gain I  do think that starting with some blood work initially will be helpful.  Wonder if she has some impaired glucose making it more difficult to lose weight.  Would be a good idea to recheck thyroid as well.  Endocrinologist usually checks all this for her.   Return for pending lab or imaging results and 1 mo bp follow up and will get prevnar. 45 minutes spent in chart review, time with patient, exam, discussion of bone density treatment, follow-up discussion, charting  Micheline Rough, MD

## 2021-12-02 ENCOUNTER — Telehealth: Payer: Self-pay | Admitting: *Deleted

## 2021-12-02 NOTE — Telephone Encounter (Signed)
-----   Message from Caren Macadam, MD sent at 12/02/2021  7:58 AM EST ----- ?Forgot to tell her yoga suggestions at visit: ? ?[facility] [employer] [facility]. Tye [place] is great. I did many sessions with her in the past and she would work with any limitations.  ? ?Other yoga I mentioned as consideration is no longer in business! ? ?

## 2021-12-02 NOTE — Telephone Encounter (Signed)
Patient informed of the message below.

## 2021-12-03 ENCOUNTER — Other Ambulatory Visit: Payer: Self-pay | Admitting: Family Medicine

## 2021-12-03 DIAGNOSIS — I10 Essential (primary) hypertension: Secondary | ICD-10-CM

## 2021-12-28 ENCOUNTER — Ambulatory Visit (INDEPENDENT_AMBULATORY_CARE_PROVIDER_SITE_OTHER): Payer: Medicare Other | Admitting: Family Medicine

## 2021-12-28 DIAGNOSIS — I10 Essential (primary) hypertension: Secondary | ICD-10-CM

## 2021-12-29 NOTE — Progress Notes (Signed)
No show

## 2022-01-22 ENCOUNTER — Telehealth: Payer: Self-pay | Admitting: Pharmacist

## 2022-01-22 NOTE — Chronic Care Management (AMB) (Signed)
? ? ?  Chronic Care Management ?Pharmacy Assistant  ? ?Name: Bonnie Moon  MRN: [id number] DOB: [birth year] ? ?01/25/2022 APPOINTMENT REMINDER ? ? ?Called [name], she was reminded of appointment on 01/25/2022 at 10:30 via telephone visit with Jeni Salles, Pharm D. Notified to have all medications, supplements, blood pressure and/or blood sugar logs available during appointment. ? ?Note: ?Patient states she stopped Pravastatin a while back and had excess when she restarted.  She states she is currently taking it and has 3 more days supply will be getting a refill.  ? ? ?Care Gaps: ?AWV - scheduled 08/12/2022 ?Last BP - 138/60 on 11/30/2021 ?Covid vaccine - overdue ?  ?Star Rating Drug: ?Pravastatin 20 mg - last [facility]/05/2021 [facility]  ?Losartan HCTZ 100/12.5 mg - last filled 103/02/2022 [facility] ? ? ?Any gaps in medications fill history? Yes, see note above ? ?Gennie Alma CMA  ?Clinical Pharmacist Assistant ?[phone] ? ?

## 2022-01-25 ENCOUNTER — Ambulatory Visit (INDEPENDENT_AMBULATORY_CARE_PROVIDER_SITE_OTHER): Payer: Medicare Other | Admitting: Pharmacist

## 2022-01-25 ENCOUNTER — Telehealth: Payer: Self-pay | Admitting: *Deleted

## 2022-01-25 DIAGNOSIS — I10 Essential (primary) hypertension: Secondary | ICD-10-CM

## 2022-01-25 DIAGNOSIS — E785 Hyperlipidemia, unspecified: Secondary | ICD-10-CM

## 2022-01-25 MED ORDER — PRAVASTATIN SODIUM 20 MG PO TABS: 20.0000 mg | ORAL_TABLET | Freq: Every day | ORAL | 0 refills | Status: DC

## 2022-01-25 NOTE — Telephone Encounter (Signed)
-----   Message from Viona Gilmore, [facility] sent at 01/25/2022 10:48 AM EDT ----- ?Regarding: Pravastatin refill ?Hi, ? ?Ms. Hata is running low on her pravastatin. Can you pleas refill this to her usual [facility] pharmacy? ? ?Thanks, ?Maddie ? ?

## 2022-01-25 NOTE — Progress Notes (Signed)
? ?Chronic Care Management ?Pharmacy Note ? ?01/25/2022 ?Name:  Bonnie Moon MRN:  [id number] DOB:  January 05, 1951 ? ?Summary: ?LDL not at goal < 100 ?BP at goal < 140/90 ? ?Recommendations/Changes made from today's visit: ?-Recommended bringing DEXA report to OBGYN visit ?-Recommended bringing BP cuff to next office visit ?-Recommend repeat lipid panel and consider Zetia or PCSK9 if LDL > 100 ?-Requested a refill for pravastatin ? ?Plan: ?Follow up in 6 months ? ?Subjective: ?Bonnie Moon is an 71 y.o. year old female who is a primary patient of Koberlein, Steele Berg, MD.  The CCM team was consulted for assistance with disease management and care coordination needs.   ? ?Engaged with patient by telephone for follow up visit in response to provider referral for pharmacy case management and/or care coordination services.  ? ?Consent to Services:  ?The patient was given information about Chronic Care Management services, agreed to services, and gave verbal consent prior to initiation of services.  Please see initial visit note for detailed documentation.  ? ?Patient Care Team: ?Caren Macadam, MD as PCP - General (Family Medicine) ?[name], MD as Attending Physician (Obstetrics and Gynecology) ?Ladene Artist, MD as Attending Physician (Gastroenterology) ?Clydie Braun, MD (Internal Medicine) ?Clydie Braun, MD (Internal Medicine) ?Viona Gilmore, [facility] as Pharmacist (Pharmacist) ? ?Recent office visits: ?12/28/21 Micheline Rough MD: Patient presented for HTN follow up. Patient no showed. ? ?11/30/21 Micheline Rough MD: Patient presented for annual exam. Increased HCTZ to 12.5 mg. Follow up in 1 month to look over lab work. ? ?Recent consult visits: ?None ? ?Hospital visits: ?None in previous 6 months ? ? ?Objective: ? ?Lab Results  ?Component Value Date  ? CREATININE 0.78 02/28/2019  ? BUN 15 02/28/2019  ? GFR 73.80 02/28/2019  ? NA 140 02/28/2019  ? K 4.2 02/28/2019  ? CALCIUM 9.8 02/28/2019  ? CALCIUM 9.7  02/28/2019  ? CO2 29 02/28/2019  ? GLUCOSE 83 02/28/2019  ? ? ?Lab Results  ?Component Value Date/Time  ? HGBA1C 5.4 01/05/2013 10:10 AM  ? GFR 73.80 02/28/2019 09:49 AM  ? GFR 85.01 01/05/2013 10:10 AM  ?  ?Last diabetic Eye exam: No results found for: HMDIABEYEEXA  ?Last diabetic Foot exam: No results found for: HMDIABFOOTEX  ? ?Lab Results  ?Component Value Date  ? CHOL 269 (H) 02/28/2019  ? HDL 79.70 02/28/2019  ? LDLCALC 173 (H) 02/28/2019  ? LDLDIRECT 179.1 01/05/2013  ? TRIG 81.0 02/28/2019  ? CHOLHDL 3 02/28/2019  ? ? ? ?  Latest Ref Rng & Units 02/28/2019  ?  9:49 AM 12/08/2015  ?  9:18 AM 10/08/2014  ?  8:57 AM  ?Hepatic Function  ?Total Protein 6.0 - 8.3 g/dL 7.1   7.0   6.8    ?Albumin 3.5 - 5.2 g/dL 4.4   4.2   4.0    ?AST 0 - 37 U/L $Remo'17   19   20    'nriCx$ ?ALT 0 - 35 U/L $Remo'10   12   14    'wkWtA$ ?Alk Phosphatase 39 - 117 U/L 95   129   121    ?Total Bilirubin 0.2 - 1.2 mg/dL 0.9   0.7   0.5    ? ? ?Lab Results  ?Component Value Date/Time  ? TSH 0.42 02/28/2019 09:49 AM  ? TSH 0.43 12/08/2015 09:18 AM  ? FREET4 1.08 02/28/2019 09:49 AM  ? FREET4 0.81 01/05/2013 10:10 AM  ? ? ? ?  Latest Ref  Rng & Units 02/28/2019  ?  9:49 AM 12/08/2015  ?  9:18 AM 10/08/2014  ?  8:57 AM  ?CBC  ?WBC 4.0 - 10.5 K/uL 5.3   5.6   6.1    ?Hemoglobin 12.0 - 15.0 g/dL 14.2   13.8   13.7    ?Hematocrit 36.0 - 46.0 % 41.5   41.4   41.5    ?Platelets 150.0 - 400.0 K/uL 273.0   267   255    ? ? ?Lab Results  ?Component Value Date/Time  ? VD25OH 65.63 02/28/2019 09:49 AM  ? VD25OH 30 12/08/2015 09:18 AM  ? VD25OH 30 10/08/2014 08:57 AM  ? ? ?Clinical ASCVD: No  ?The 10-year ASCVD risk score (Arnett DK, et al., 2019) is: 13.3% ?  Values used to calculate the score: ?    Age: 39 years ?    Sex: Female ?    Is Non-Hispanic African American: No ?    Diabetic: No ?    Tobacco smoker: No ?    Systolic Blood Pressure: 222 mmHg ?    Is BP treated: Yes ?    HDL Cholesterol: 79.7 mg/dL ?    Total Cholesterol: 269 mg/dL   ? ? ?  11/30/2021  ? 12:17 PM 08/11/2021  ?   1:10 PM 06/16/2020  ? 11:25 AM  ?Depression screen PHQ 2/9  ?Decreased Interest 0 0 0  ?Down, Depressed, Hopeless 0 0 0  ?PHQ - 2 Score 0 0 0  ?Altered sleeping 0    ?Tired, decreased energy 0    ?Change in appetite 0    ?Feeling bad or failure about yourself  0    ?Trouble concentrating 0    ?Moving slowly or fidgety/restless 0    ?Suicidal thoughts 0    ?PHQ-9 Score 0    ?  ? ?Social History  ? ?Tobacco Use  ?Smoking Status Never  ?Smokeless Tobacco Never  ? ?BP Readings from Last 3 Encounters:  ?11/30/21 138/80  ?07/30/21 131/71  ?12/22/20 125/74  ? ?Pulse Readings from Last 3 Encounters:  ?11/30/21 (!) 58  ?07/30/21 60  ?12/22/20 (!) 56  ? ?Wt Readings from Last 3 Encounters:  ?11/30/21 147 lb 9.6 oz (67 kg)  ?08/11/21 142 lb (64.4 kg)  ?07/30/21 144 lb (65.3 kg)  ? ?BMI Readings from Last 3 Encounters:  ?11/30/21 26.15 kg/m?  ?08/11/21 25.97 kg/m?  ?07/30/21 25.51 kg/m?  ? ? ?Assessment/Interventions: Review of patient past medical history, allergies, medications, health status, including review of consultants reports, laboratory and other test data, was performed as part of comprehensive evaluation and provision of chronic care management services.  ? ?SDOH:  (Social Determinants of Health) assessments and interventions performed: Yes ? ? ?SDOH Screenings  ? ?Alcohol Screen: Not on file  ?Depression (PHQ2-9): Low Risk   ? PHQ-2 Score: 0  ?Financial Resource Strain: Low Risk   ? Difficulty of Paying Living Expenses: Not hard at all  ?Food Insecurity: No Food Insecurity  ? Worried About Charity fundraiser in the Last Year: Never true  ? Ran Out of Food in the Last Year: Never true  ?Housing: Low Risk   ? Last Housing Risk Score: 0  ?Physical Activity: Sufficiently Active  ? Days of Exercise per Week: 4 days  ? Minutes of Exercise per Session: 50 min  ?Social Connections: Moderately Integrated  ? Frequency of Communication with Friends and Family: More than three times a week  ? Frequency of Social Gatherings  with Friends and Family: More than three times a week  ? Attends Religious Services: More than 4 times per year  ? Active Member of Clubs or Organizations: Yes  ? Attends Archivist Meetings: More than 4 times per year  ? Marital Status: Widowed  ?Stress: No Stress Concern Present  ? Feeling of Stress : Not at all  ?Tobacco Use: Low Risk   ? Smoking Tobacco Use: Never  ? Smokeless Tobacco Use: Never  ? Passive Exposure: Not on file  ?Transportation Needs: No Transportation Needs  ? Lack of Transportation (Medical): No  ? Lack of Transportation (Non-Medical): No  ? ? ?CCM Care Plan ? ?Allergies  ?Allergen Reactions  ? Lipitor [Atorvastatin Calcium]   ? ? ?Medications Reviewed Today   ? ? Reviewed by Agnes Lawrence, CMA (Certified Medical Assistant) on 11/30/21 at 1055  Med List Status: <None>  ? ?Medication Order Taking? Sig Documenting Provider Last Dose Status Informant  ?ALPRAZolam (XANAX) 0.25 MG tablet [id number] Yes 1 tab orally as needed for anxiety not to exceed more than two a day Caren Macadam, MD Taking Active   ?Ascorbic Acid (VITAMIN C PO) [id number] Yes Take 250 mg by mouth daily. [provider] Taking Active   ?cetirizine (ZYRTEC ALLERGY) 10 MG tablet [id number] Yes Take 1 tablet (10 mg total) by mouth daily. Caren Macadam, MD Taking Active   ?Cholecalciferol (VITAMIN D-3) 1000 UNITS CAPS [id number] Yes Take 2,000 Units by mouth. [provider] Taking Active   ?famotidine (PEPCID) 20 MG tablet [id number] Yes Take 20 mg by mouth as needed for heartburn or indigestion. [provider] Taking Active   ?losartan-hydrochlorothiazide (HYZAAR) 100-12.5 MG tablet [id number] Yes TAKE 1 TABLET BY MOUTH EVERY DAY Koberlein, Steele Berg, MD Taking Active   ?Multiple Vitamins-Minerals (MULTIVITAMIN PO) [id number] Yes Take by mouth. gummies 2 daily [provider] Taking Active   ?pravastatin (PRAVACHOL) 20 MG tablet [id number] Yes TAKE 1 TABLET BY MOUTH EVERY DAY  Koberlein, Steele Berg, MD Taking Active   ?Turmeric-Ginger 150-25 MG CHEW [id number] Yes Chew 1 tablet by mouth daily. [provider] Taking Active   ? ?  ?  ? ?  ? ? ?Patient Active Problem List  ? Sharma Covert

## 2022-01-25 NOTE — Telephone Encounter (Signed)
Rx done. 

## 2022-02-15 ENCOUNTER — Ambulatory Visit (HOSPITAL_BASED_OUTPATIENT_CLINIC_OR_DEPARTMENT_OTHER): Payer: Medicare Other | Admitting: Obstetrics & Gynecology

## 2022-02-24 DIAGNOSIS — E785 Hyperlipidemia, unspecified: Secondary | ICD-10-CM | POA: Diagnosis not present

## 2022-02-24 DIAGNOSIS — I10 Essential (primary) hypertension: Secondary | ICD-10-CM | POA: Diagnosis not present

## 2022-02-24 DIAGNOSIS — M8580 Other specified disorders of bone density and structure, unspecified site: Secondary | ICD-10-CM

## 2022-03-24 DIAGNOSIS — E785 Hyperlipidemia, unspecified: Secondary | ICD-10-CM | POA: Diagnosis not present

## 2022-03-24 DIAGNOSIS — E559 Vitamin D deficiency, unspecified: Secondary | ICD-10-CM | POA: Diagnosis not present

## 2022-03-24 DIAGNOSIS — R739 Hyperglycemia, unspecified: Secondary | ICD-10-CM | POA: Diagnosis not present

## 2022-03-24 DIAGNOSIS — E042 Nontoxic multinodular goiter: Secondary | ICD-10-CM | POA: Diagnosis not present

## 2022-03-25 DIAGNOSIS — Z1231 Encounter for screening mammogram for malignant neoplasm of breast: Secondary | ICD-10-CM | POA: Diagnosis not present

## 2022-03-31 DIAGNOSIS — I10 Essential (primary) hypertension: Secondary | ICD-10-CM | POA: Diagnosis not present

## 2022-03-31 DIAGNOSIS — E559 Vitamin D deficiency, unspecified: Secondary | ICD-10-CM | POA: Diagnosis not present

## 2022-03-31 DIAGNOSIS — E210 Primary hyperparathyroidism: Secondary | ICD-10-CM | POA: Diagnosis not present

## 2022-03-31 DIAGNOSIS — E042 Nontoxic multinodular goiter: Secondary | ICD-10-CM | POA: Diagnosis not present

## 2022-03-31 DIAGNOSIS — R739 Hyperglycemia, unspecified: Secondary | ICD-10-CM | POA: Diagnosis not present

## 2022-03-31 DIAGNOSIS — E785 Hyperlipidemia, unspecified: Secondary | ICD-10-CM | POA: Diagnosis not present

## 2022-03-31 DIAGNOSIS — R946 Abnormal results of thyroid function studies: Secondary | ICD-10-CM | POA: Diagnosis not present

## 2022-03-31 DIAGNOSIS — M810 Age-related osteoporosis without current pathological fracture: Secondary | ICD-10-CM | POA: Diagnosis not present

## 2022-04-05 ENCOUNTER — Encounter (HOSPITAL_BASED_OUTPATIENT_CLINIC_OR_DEPARTMENT_OTHER): Payer: Self-pay | Admitting: *Deleted

## 2022-04-20 ENCOUNTER — Telehealth: Payer: Self-pay | Admitting: Pharmacist

## 2022-04-20 NOTE — Chronic Care Management (AMB) (Signed)
Chronic Care Management Pharmacy Assistant   Name: Bonnie Moon  MRN: [id number] DOB: 12-01-1950  Reason for Encounter: Disease State / Hypertension Assessment Call   Conditions to be addressed/monitored: HTN  Recent office visits:  None  Recent consult visits:  None  Hospital visits:  None  Medications: Outpatient Encounter Medications as of 04/20/2022  Medication Sig   ALPRAZolam (XANAX) 0.25 MG tablet 1 tab orally as needed for anxiety not to exceed more than two a day   Ascorbic Acid (VITAMIN C PO) Take 250 mg by mouth daily.   cetirizine (ZYRTEC ALLERGY) 10 MG tablet Take 1 tablet (10 mg total) by mouth daily.   Cholecalciferol (VITAMIN D-3) 1000 UNITS CAPS Take 2,000 Units by mouth.   famotidine (PEPCID) 20 MG tablet Take 20 mg by mouth as needed for heartburn or indigestion.   losartan-hydrochlorothiazide (HYZAAR) 100-25 MG tablet Take 1 tablet by mouth daily.   Multiple Vitamins-Minerals (MULTIVITAMIN PO) Take by mouth. gummies 2 daily   pravastatin (PRAVACHOL) 20 MG tablet Take 1 tablet (20 mg total) by mouth daily.   Turmeric-Ginger 150-25 MG CHEW Chew 1 tablet by mouth daily.   No facility-administered encounter medications on file as of 04/20/2022.  Fill History: alprazolam 0.25 mg tablet 06/16/2020 30   CETIRIZINE HCL 10 MG TABLET 12/11/2020 90   LOSARTAN-HCTZ 100-25 MG TAB 11/30/2021 90   PRAVASTATIN SODIUM 20 MG TAB 03/05/2021 90   Reviewed chart prior to disease state call. Spoke with patient regarding BP  Recent Office Vitals: BP Readings from Last 3 Encounters:  11/30/21 138/80  07/30/21 131/71  12/22/20 125/74   Pulse Readings from Last 3 Encounters:  11/30/21 (!) 58  07/30/21 60  12/22/20 (!) 56    Wt Readings from Last 3 Encounters:  11/30/21 147 lb 9.6 oz (67 kg)  08/11/21 142 lb (64.4 kg)  07/30/21 144 lb (65.3 kg)     Kidney Function Lab Results  Component Value Date/Time   CREATININE 0.78 02/28/2019 09:49 AM   CREATININE  0.62 12/08/2015 09:18 AM   CREATININE 0.70 10/08/2014 08:57 AM   GFR 73.80 02/28/2019 09:49 AM       Latest Ref Rng & Units 02/28/2019    9:49 AM 08/30/2016    4:17 PM 12/08/2015    9:18 AM  BMP  Glucose 70 - 99 mg/dL 83   81   BUN 6 - 23 mg/dL 15   12   Creatinine 0.40 - 1.20 mg/dL 0.78   0.62   Sodium 135 - 145 mEq/L 140   141   Potassium 3.5 - 5.1 mEq/L 4.2   4.2   Chloride 96 - 112 mEq/L 102   104   CO2 19 - 32 mEq/L 29   29   Calcium 8.6 - 10.4 mg/dL 8.4 - 10.5 mg/dL 9.8    9.7  10.4  10.3     Current antihypertensive regimen:  Losartan HCTZ 100/25 mg daily  How often are you checking your Blood Pressure?   Current home BP readings:   What recent interventions/DTPs have been made by any provider to improve Blood Pressure control since last CPP Visit: No recent interventions  Any recent hospitalizations or ED visits since last visit with CPP? No recent hospital visits.  What diet changes have been made to improve Blood Pressure Control?  Patient follows  Breakfast  - patient will have Lunch - patient will have Dinner - patient will have  What exercise is being done to  improve your Blood Pressure Control?    Adherence Review: Is the patient currently on ACE/ARB medication? Yes Does the patient have >5 day gap between last estimated fill dates? No  Patient has requested to be unenrolled, she states she has specialist that are managing her medications and doesn't feel the need for our services at this time.   Care Gaps: AWV - scheduled 08/12/2022 Last BP - 138/60 on 11/30/2021 Shingrix - never done Covid booster - overdue Pneumonia - postponed  Star Rating Drugs: Losartan HCTZ 100/25 mg - last filled 02/23/2022 90 DS at [facility] verified with Otila Kluver Pravastatin 20 mg  - last filled 03/05/2022 90 DS at [facility] Pharmacist Assistant [phone]

## 2022-05-13 ENCOUNTER — Ambulatory Visit (HOSPITAL_BASED_OUTPATIENT_CLINIC_OR_DEPARTMENT_OTHER): Payer: Medicare Other | Admitting: Obstetrics & Gynecology

## 2022-06-14 DIAGNOSIS — D225 Melanocytic nevi of trunk: Secondary | ICD-10-CM | POA: Diagnosis not present

## 2022-06-14 DIAGNOSIS — Z1283 Encounter for screening for malignant neoplasm of skin: Secondary | ICD-10-CM | POA: Diagnosis not present

## 2022-06-14 DIAGNOSIS — L304 Erythema intertrigo: Secondary | ICD-10-CM | POA: Diagnosis not present

## 2022-06-30 DIAGNOSIS — H2513 Age-related nuclear cataract, bilateral: Secondary | ICD-10-CM | POA: Diagnosis not present

## 2022-07-02 ENCOUNTER — Ambulatory Visit (INDEPENDENT_AMBULATORY_CARE_PROVIDER_SITE_OTHER): Payer: Medicare Other | Admitting: Family Medicine

## 2022-07-02 ENCOUNTER — Encounter: Payer: Self-pay | Admitting: Family Medicine

## 2022-07-02 VITALS — BP 144/80 | HR 73 | Temp 97.8°F | Ht 63.0 in | Wt 147.9 lb

## 2022-07-02 DIAGNOSIS — E782 Mixed hyperlipidemia: Secondary | ICD-10-CM

## 2022-07-02 DIAGNOSIS — I10 Essential (primary) hypertension: Secondary | ICD-10-CM

## 2022-07-02 NOTE — Patient Instructions (Signed)
Check blood pressure daily for 2 weeks - -as long as you are at goal (less than 140/90) we do not need to change or add medication. If you are not at goal, leave me a message and we will discuss adding a small dose of another BP medication.

## 2022-07-02 NOTE — Progress Notes (Signed)
Established Patient Office Visit  Subjective   Patient ID: Bonnie Moon, female    DOB: 1951/04/12  Age: 71 y.o. MRN: [id number]  Chief Complaint  Patient presents with   Head Injury    Patient states she tripped on an acorn while walking 1 week ago, hit her head, did not seek treatment in the ER   Fall    Patient states she also hit her right lower leg after falling 1 week ago    Patient was walking in the park and she tripped on an acorn and fell, states she fell forward, she turned her head to the left, put her hands out in front of her, hit the right side of her head. Did not lose consciousness, states she went to her ophthalmologist and was told her eye was ok. States she had a lump on the side of her head but it has gone down.   HTN-- BP performed in office today and was slightly elevated. Pt reports she checks it at home and it is usually in the 130's. States it always elevated when she gets nervous or comes to the doctor. Pt is walking every day for exercise, she reports she adheres to a low salt  diet, is compliant with her medication. No chest pain or SOB, no dizziness.  Head Injury  The incident occurred 5 to 7 days ago. There was no loss of consciousness. There was no blood loss. The pain is at a severity of 0/10. The patient is experiencing no pain. Pertinent negatives include no blurred vision, disorientation, memory loss, numbness or weakness. She has tried acetaminophen for the symptoms.  Fall Pertinent negatives include no numbness.   Current Outpatient Medications  Medication Instructions   ALPRAZolam (XANAX) 0.25 MG tablet 1 tab orally as needed for anxiety not to exceed more than two a day   Ascorbic Acid (VITAMIN C PO) 250 mg, Oral, Daily   cetirizine (ZYRTEC ALLERGY) 10 mg, Oral, Daily   famotidine (PEPCID) 20 mg, Oral, As needed   Multiple Vitamins-Minerals (MULTIVITAMIN PO) Oral, gummies 2 daily    rosuvastatin (CRESTOR) 5 mg, Oral, Daily    telmisartan-hydrochlorothiazide (MICARDIS HCT) 80-25 MG tablet 1 tablet, Oral, Daily   Turmeric-Ginger 150-25 MG CHEW 1 tablet, Oral, Daily   Vitamin D-3 2,000 Units, Oral     Patient Active Problem List   Diagnosis Date Noted   Cervical spine arthritis 03/05/2019   Osteoporosis 08/15/2018   Hypertension 08/15/2018   Anxiety 08/15/2018   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  Neurological:  Negative for weakness and numbness.  Psychiatric/Behavioral:  Negative for memory loss.   All other systems reviewed and are negative.     Objective:     BP (!) 144/80 (BP Location: Left Arm, Patient Position: Sitting, Cuff Size: Normal)   Pulse 73   Temp 97.8 F (36.6 C) (Oral)   Ht '5\' 3"'$  (1.6 m)   Wt 147 lb 14.4 oz (67.1 kg)   LMP 07/28/2002   SpO2 98%   BMI 26.20 kg/m  BP Readings from Last 3 Encounters:  07/02/22 (!) 144/80  11/30/21 138/80  07/30/21 131/71      Physical Exam Vitals reviewed.  Constitutional:  Appearance: Normal appearance. She is well-groomed and normal weight.  HENT:     Head: Normocephalic and atraumatic. Head contusion: there is a healing bruise on the right frontal/temporal area, there are no signs of swelling on the scalp.  Eyes:     Conjunctiva/sclera: Conjunctivae normal.  Cardiovascular:     Rate and Rhythm: Normal rate and regular rhythm.     Heart sounds: S1 normal and S2 normal.  Pulmonary:     Effort: Pulmonary effort is normal.     Breath sounds: Normal breath sounds and air entry.  Abdominal:     General: Bowel sounds are normal.  Musculoskeletal:        General: Normal range of motion.     Right lower leg: No edema.     Left lower leg: No edema.   Neurological:     Mental Status: She is alert and oriented to person, place, and time. Mental status is at baseline.     Gait: Gait is intact.  Psychiatric:        Mood and Affect: Mood and affect normal.        Speech: Speech normal.        Behavior: Behavior normal.        Judgment: Judgment normal.      No results found for any visits on 07/02/22.  Last metabolic panel Lab Results  Component Value Date   GLUCOSE 83 02/28/2019   NA 140 02/28/2019   K 4.2 02/28/2019   CL 102 02/28/2019   CO2 29 02/28/2019   BUN 15 02/28/2019   CREATININE 0.78 02/28/2019   CALCIUM 9.8 02/28/2019   CALCIUM 9.7 02/28/2019   PROT 7.1 02/28/2019   ALBUMIN 4.4 02/28/2019   BILITOT 0.9 02/28/2019   ALKPHOS 95 02/28/2019   AST 17 02/28/2019   ALT 10 02/28/2019   Last lipids Lab Results  Component Value Date   CHOL 269 (H) 02/28/2019   HDL 79.70 02/28/2019   LDLCALC 173 (H) 02/28/2019   LDLDIRECT 179.1 01/05/2013   TRIG 81.0 02/28/2019   CHOLHDL 3 02/28/2019      The ASCVD Risk score (Arnett DK, et al., 2019) failed to calculate for the following reasons:   Cannot find a previous HDL lab   Cannot find a previous total cholesterol lab    Assessment & Plan:   Problem List Items Addressed This Visit       Cardiovascular and Mediastinum   Hypertension - Primary    Current hypertension medications:       Sig   telmisartan-hydrochlorothiazide (MICARDIS HCT) 80-25 MG tablet (Taking) Take 1 tablet by mouth daily.  BP performed in office today and was borderline elevated. Pt reports she is nervous in the office today. States she checks it regularly at home. I advised that she continue to check her BP at home and if she is consistently >140 then she should call me and we will discuss increasing her medication.      Relevant Medications   telmisartan-hydrochlorothiazide (MICARDIS HCT) 80-25 MG tablet   rosuvastatin (CRESTOR) 5 MG tablet     Other   Hyperlipidemia    Pt reports she  is tolerating the statin well, no side effects, she needs refills on this today. States that her specialist usually orders her bloodwork. I have sent a communication asking for the most recent set of labs. Will continue crestor 5 mg daily      Relevant Medications   telmisartan-hydrochlorothiazide (MICARDIS HCT) 80-25 MG tablet  rosuvastatin (CRESTOR) 5 MG tablet    Return in about 6 months (around 01/01/2023) for follow up HTN.    Farrel Conners, MD

## 2022-07-05 NOTE — Assessment & Plan Note (Signed)
Pt reports she is tolerating the statin well, no side effects, she needs refills on this today. States that her specialist usually orders her bloodwork. I have sent a communication asking for the most recent set of labs. Will continue crestor 5 mg daily

## 2022-07-05 NOTE — Assessment & Plan Note (Signed)
Current hypertension medications:      Sig   telmisartan-hydrochlorothiazide (MICARDIS HCT) 80-25 MG tablet (Taking) Take 1 tablet by mouth daily.    BP performed in office today and was borderline elevated. Pt reports she is nervous in the office today. States she checks it regularly at home. I advised that she continue to check her BP at home and if she is consistently >140 then she should call me and we will discuss increasing her medication.

## 2022-07-16 ENCOUNTER — Encounter: Payer: Medicare Other | Admitting: Family Medicine

## 2022-07-29 ENCOUNTER — Ambulatory Visit (INDEPENDENT_AMBULATORY_CARE_PROVIDER_SITE_OTHER): Payer: Medicare Other | Admitting: Obstetrics & Gynecology

## 2022-07-29 ENCOUNTER — Encounter (HOSPITAL_BASED_OUTPATIENT_CLINIC_OR_DEPARTMENT_OTHER): Payer: Self-pay | Admitting: Obstetrics & Gynecology

## 2022-07-29 VITALS — BP 150/70 | HR 62 | Ht 63.5 in | Wt 149.0 lb

## 2022-07-29 DIAGNOSIS — Z780 Asymptomatic menopausal state: Secondary | ICD-10-CM

## 2022-07-29 DIAGNOSIS — E892 Postprocedural hypoparathyroidism: Secondary | ICD-10-CM | POA: Diagnosis not present

## 2022-07-29 DIAGNOSIS — Z01419 Encounter for gynecological examination (general) (routine) without abnormal findings: Secondary | ICD-10-CM | POA: Diagnosis not present

## 2022-07-29 DIAGNOSIS — I10 Essential (primary) hypertension: Secondary | ICD-10-CM | POA: Diagnosis not present

## 2022-07-29 DIAGNOSIS — M810 Age-related osteoporosis without current pathological fracture: Secondary | ICD-10-CM | POA: Diagnosis not present

## 2022-07-29 NOTE — Progress Notes (Signed)
71 y.o. G4P3 Widowed White or Caucasian female here for breast and pelvic exam.  I am also following her for h/o osteoporosis.  Does not want treatment.  Last BMD was in 2022.  It did show osteoporosis.  Still seeing Dr Shawna Orleans, endocrinologist, in [place].  Appt is in January  Denies vaginal bleeding.  Patient's last menstrual period was 07/28/2002.          Sexually active: No.  H/O STD:  no  Health Maintenance: PCP:  Dr. Legrand Como.  Last wellness appt was earlier this year.  Did blood work at that appt:  no  Will have blood work done with Dr. Shawna Orleans in January, endocrinologist, in [place], [place] are up to date:  no.  Reviewed with pt.  She is going to update her flu shot and consider the RSV vaccination. Colonoscopy:  07/30/2021, tubular adenoma, 5 year follow up MMG:  03/25/2022 Negative BMD:  02/04/2021, osteopenia Last pap smear:  12/22/2020 Negative.   H/o abnormal pap smear:  no    reports that she has never smoked. She has never used smokeless tobacco. She reports that she does not currently use alcohol. She reports that she does not use drugs.  Past Medical History:  Diagnosis Date   Allergy    GERD (gastroesophageal reflux disease)    Hyperlipidemia    IBS (irritable bowel syndrome)    Nephrolithiasis    Osteopenia    Thyroid nodule    Tubular adenoma of colon 02/2009   Vitamin D insufficiency     Past Surgical History:  Procedure Laterality Date   BREAST ENHANCEMENT SURGERY     COLONOSCOPY  04/11/2014   Dr.Stark   PARATHYROIDECTOMY  01/17/2017   done by Dr. Oran Rein- at [facility] in [place]  (1/2 parathyroid remains)   TONSILLECTOMY     TOTAL HIP ARTHROPLASTY Left 05/2008   TOTAL HIP ARTHROPLASTY Right 04/02/2020   Dr Reynolds Bowl patient    Current Outpatient Medications  Medication Sig Dispense Refill   ALPRAZolam (XANAX) 0.25 MG tablet 1 tab orally as needed for anxiety not to exceed more than two a day 30 tablet 0   Ascorbic Acid (VITAMIN C PO) Take 250 mg by mouth  daily.     cetirizine (ZYRTEC ALLERGY) 10 MG tablet Take 1 tablet (10 mg total) by mouth daily. 90 tablet 3   Cholecalciferol (VITAMIN D-3) 1000 UNITS CAPS Take 2,000 Units by mouth.     famotidine (PEPCID) 20 MG tablet Take 20 mg by mouth as needed for heartburn or indigestion.     Multiple Vitamins-Minerals (MULTIVITAMIN PO) Take by mouth. gummies 2 daily     rosuvastatin (CRESTOR) 5 MG tablet Take 5 mg by mouth daily.     telmisartan-hydrochlorothiazide (MICARDIS HCT) 80-25 MG tablet Take 1 tablet by mouth daily.     No current facility-administered medications for this visit.    Family History  Problem Relation Age of Onset   Osteopenia Mother    Heart disease Mother        a. fib   High Cholesterol Mother    Heart failure Mother        pacemaker   Other Mother        parathyroid disease   Atrial fibrillation Father    Alzheimer's disease Father    Other Father        pacemaker   Other Sister        parathyroid   Thyroid nodules Brother    Thyroid disease Daughter  nodule, cancer   High Cholesterol Daughter    Colon cancer Neg Hx    Pancreatic cancer Neg Hx    Rectal cancer Neg Hx    Stomach cancer Neg Hx    Esophageal cancer Neg Hx    Colon polyps Neg Hx     Review of Systems  Constitutional: Negative.   Genitourinary: Negative.     Exam:   BP (!) 150/70 (BP Location: Right Arm, Patient Position: Sitting, Cuff Size: Large)   Pulse 62   Ht 5' 3.5" (1.613 m) Comment: Reported  Wt 149 lb (67.6 kg)   LMP 07/28/2002   BMI 25.98 kg/m   Height: 5' 3.5" (161.3 cm) (Reported)  General appearance: alert, cooperative and appears stated age Breasts: normal appearance, no masses or tenderness Abdomen: soft, non-tender; bowel sounds normal; no masses,  no organomegaly Lymph nodes: Cervical, supraclavicular, and axillary nodes normal.  No abnormal inguinal nodes palpated Neurologic: Grossly normal  Pelvic: External genitalia:  no lesions              Urethra:   normal appearing urethra with no masses, tenderness or lesions              Bartholins and Skenes: normal                 Vagina: normal appearing vagina with atrophic changes and no discharge, no lesions              Cervix: no lesions              Pap taken: No. Bimanual Exam:  Uterus:  normal size, contour, position, consistency, mobility, non-tender              Adnexa: normal adnexa and no mass, fullness, tenderness               Rectovaginal: Confirms               Anus:  normal sphincter tone, no lesions  Chaperone, Octaviano Batty, CMA, was present for exam.  Assessment/Plan: 1. Encntr for gyn exam (general) (routine) w/o abn findings - Pap smear neg 2022 - Mammogram up to date - Colonoscopy 07/2021 - Bone mineral density 2022 - lab work will be done with Dr. Shawna Orleans - vaccines reviewed/updated  2. Postmenopausal - no HRT  3. Age-related osteoporosis without current pathological fracture - declines treatment  4. S/P parathyroidectomy ([facility]) - followed by Dr. Shawna Orleans  5. Primary hypertension - on telmisaratan/HCTZ

## 2022-08-24 ENCOUNTER — Telehealth: Payer: Self-pay | Admitting: Family Medicine

## 2022-08-24 NOTE — Telephone Encounter (Signed)
Spoke with patient to schedule Medicare Annual Wellness Visit (AWV) either virtually or in office.   Patient stated she would call back after first of year to busy right now    Last AWV  08/11/21  please schedule with Nurse Health Adviser   45 min for awv-i and in office appointments 30 min for awv-s  phone/virtual appointments

## 2022-09-08 ENCOUNTER — Telehealth (INDEPENDENT_AMBULATORY_CARE_PROVIDER_SITE_OTHER): Payer: Medicare Other | Admitting: Family Medicine

## 2022-09-08 ENCOUNTER — Encounter: Payer: Self-pay | Admitting: Family Medicine

## 2022-09-08 ENCOUNTER — Telehealth: Payer: Self-pay | Admitting: Family Medicine

## 2022-09-08 VITALS — Ht 63.5 in | Wt 149.0 lb

## 2022-09-08 DIAGNOSIS — B001 Herpesviral vesicular dermatitis: Secondary | ICD-10-CM | POA: Diagnosis not present

## 2022-09-08 MED ORDER — VALACYCLOVIR HCL 1 G PO TABS: ORAL_TABLET | ORAL | 1 refills | Status: DC

## 2022-09-08 NOTE — Telephone Encounter (Signed)
Spoke with the patient and informed her a visit is needed since this is a new problem for her.  Offered an appt with PCP tomorrow and the patient declined and due to family problems.  Virtual visit was scheduled today with Dr Elease Hashimoto as PCP is out of the office.

## 2022-09-08 NOTE — Telephone Encounter (Signed)
Pt requesting medication for fever blisters

## 2022-09-08 NOTE — Progress Notes (Signed)
Patient ID: Bonnie Moon, female   DOB: April 22, 1951, 71 y.o.   MRN: [id number]   Virtual Visit via Video Note  I connected with Bonnie Moon on 09/08/22 at  5:00 PM EST by a video enabled telemedicine application and verified that I am speaking with the correct person using two identifiers.  Location patient: home Location provider:work or home office Persons participating in the virtual visit: patient, provider  I discussed the limitations of evaluation and management by telemedicine and the availability of in person appointments. The patient expressed understanding and agreed to proceed.   HPI: Bonnie Moon called with onset of cold sore near her mid lower lip couple days ago.  She states that both her parents were recently hospitalized in [facility] and she feels like the stress of this may have triggered the cold sore.  She had not had a cold sore in quite some time until this.  She has tried over-the-counter topicals without much improvement.  She would like to consider antiviral therapy orally for future occurrence.   ROS: See pertinent positives and negatives per HPI.  Past Medical History:  Diagnosis Date   Allergy    GERD (gastroesophageal reflux disease)    Hyperlipidemia    IBS (irritable bowel syndrome)    Nephrolithiasis    Osteopenia    Thyroid nodule    Tubular adenoma of colon 02/2009   Vitamin D insufficiency     Past Surgical History:  Procedure Laterality Date   BREAST ENHANCEMENT SURGERY     COLONOSCOPY  04/11/2014   Dr.Stark   PARATHYROIDECTOMY  01/17/2017   done by Dr. Oran Rein- at [facility] in [place]  (1/2 parathyroid remains)   TONSILLECTOMY     TOTAL HIP ARTHROPLASTY Left 05/2008   TOTAL HIP ARTHROPLASTY Right 04/02/2020   Dr Reynolds Bowl patient    Family History  Problem Relation Age of Onset   Osteopenia Mother    Heart disease Mother        a. fib   High Cholesterol Mother    Heart failure Mother        pacemaker   Other Mother         parathyroid disease   Atrial fibrillation Father    Alzheimer's disease Father    Other Father        pacemaker   Other Sister        parathyroid   Thyroid nodules Brother    Thyroid disease Daughter        nodule, cancer   High Cholesterol Daughter    Colon cancer Neg Hx    Pancreatic cancer Neg Hx    Rectal cancer Neg Hx    Stomach cancer Neg Hx    Esophageal cancer Neg Hx    Colon polyps Neg Hx     SOCIAL HX: Non-smoker   Current Outpatient Medications:    ALPRAZolam (XANAX) 0.25 MG tablet, 1 tab orally as needed for anxiety not to exceed more than two a day, Disp: 30 tablet, Rfl: 0   Ascorbic Acid (VITAMIN C PO), Take 250 mg by mouth daily., Disp: , Rfl:    cetirizine (ZYRTEC ALLERGY) 10 MG tablet, Take 1 tablet (10 mg total) by mouth daily., Disp: 90 tablet, Rfl: 3   Cholecalciferol (VITAMIN D-3) 1000 UNITS CAPS, Take 2,000 Units by mouth., Disp: , Rfl:    famotidine (PEPCID) 20 MG tablet, Take 20 mg by mouth as needed for heartburn or indigestion., Disp: , Rfl:    Multiple  Vitamins-Minerals (MULTIVITAMIN PO), Take by mouth. gummies 2 daily, Disp: , Rfl:    rosuvastatin (CRESTOR) 5 MG tablet, Take 5 mg by mouth daily., Disp: , Rfl:    telmisartan-hydrochlorothiazide (MICARDIS HCT) 80-25 MG tablet, Take 1 tablet by mouth daily., Disp: , Rfl:    valACYclovir (VALTREX) 1000 MG tablet, Take two tablets by mouth as needed with onset of cold sores and repeat two in 12 hours, Disp: 30 tablet, Rfl: 1  EXAM:  VITALS per patient if applicable:  GENERAL: alert, oriented, appears well and in no acute distress  HEENT: atraumatic, conjunttiva clear, no obvious abnormalities on inspection of external nose and ears  NECK: normal movements of the head and neck  LUNGS: on inspection no signs of respiratory distress, breathing rate appears normal, no obvious gross SOB, gasping or wheezing  CV: no obvious cyanosis  MS: moves all visible extremities without noticeable  abnormality  PSYCH/NEURO: pleasant and cooperative, no obvious depression or anxiety, speech and thought processing grossly intact  ASSESSMENT AND PLAN:  Discussed the following assessment and plan:  Cold sores.  We agreed to send in Valtrex 1 g take 2 at onset of cold sore and repeat 2 in 12 hours.  She is aware that this cold sore will not benefit from Valtrex as she has had this for couple days.     I discussed the assessment and treatment plan with the patient. The patient was provided an opportunity to ask questions and all were answered. The patient agreed with the plan and demonstrated an understanding of the instructions.   The patient was advised to call back or seek an in-person evaluation if the symptoms worsen or if the condition fails to improve as anticipated.     Carolann Littler, MD

## 2022-09-16 ENCOUNTER — Other Ambulatory Visit: Payer: Self-pay | Admitting: Family Medicine

## 2022-10-18 ENCOUNTER — Telehealth: Payer: Self-pay | Admitting: Family Medicine

## 2022-10-18 NOTE — Telephone Encounter (Signed)
Spoke with patient to schedule Medicare Annual Wellness Visit (AWV) either virtually or in office. Pt stated she would call back to schedule  she has a lot going on right now   Last AWV 08/11/21 please schedule with Nurse Health Adviser   45 min for awv-i  in office appointments 30 min for awv-s & awv-i phone/virtual appointments

## 2022-11-10 ENCOUNTER — Telehealth: Payer: Self-pay | Admitting: Family Medicine

## 2022-11-10 NOTE — Telephone Encounter (Signed)
Left message for patient to call back and schedule Medicare Annual Wellness Visit (AWV) either virtually or in office. Left  my Herbie Drape number [phone]   Last AWV  08/11/21 please schedule with Nurse Health Adviser  30 min for all AWV appointments

## 2022-12-29 ENCOUNTER — Ambulatory Visit: Payer: Medicare Other | Admitting: Family Medicine

## 2023-01-26 ENCOUNTER — Telehealth: Payer: Self-pay | Admitting: Family Medicine

## 2023-01-26 NOTE — Telephone Encounter (Signed)
Contacted Bonnie Moon to schedule their annual wellness visit. Appointment made for 05/10/23.  Bonnie Moon AWV direct phone # [phone]

## 2023-05-10 ENCOUNTER — Encounter: Payer: Medicare Other | Admitting: Family Medicine

## 2023-05-19 ENCOUNTER — Telehealth: Payer: Self-pay | Admitting: Family Medicine

## 2023-05-19 ENCOUNTER — Encounter: Payer: Self-pay | Admitting: *Deleted

## 2023-05-19 NOTE — Telephone Encounter (Signed)
Yes ok to write letter

## 2023-05-19 NOTE — Telephone Encounter (Signed)
Pt is over 57 and per pt [employer] need a letter that its ok for her to join [facility]

## 2023-06-08 ENCOUNTER — Telehealth: Payer: Self-pay | Admitting: Family Medicine

## 2023-06-08 NOTE — Telephone Encounter (Signed)
Prescription Request  06/08/2023  LOV: 07/02/2022  What is the name of the medication or equipment? telmisartan-hydrochlorothiazide telmisartan-hydrochlorothiazide (MICARDIS HCT) 80-25 MG tablet  Have you contacted your pharmacy to request a refill? Yes   Which pharmacy would you like this sent to?  [id number] - [place], [place] - [address] [place] [address] Phone: [phone] Fax: [phone]    Patient notified that their request is being sent to the clinical staff for review and that they should receive a response within 2 business days.   Please advise at Mobile [phone] (mobile)

## 2023-06-09 MED ORDER — TELMISARTAN-HCTZ 80-25 MG PO TABS: 1.0000 | ORAL_TABLET | Freq: Every day | ORAL | 1 refills | Status: DC

## 2023-06-09 NOTE — Telephone Encounter (Signed)
Rx done. 

## 2023-06-09 NOTE — Telephone Encounter (Signed)
Ok to refill 90 days plus 1 refil

## 2023-06-21 ENCOUNTER — Encounter: Payer: Self-pay | Admitting: Family Medicine

## 2023-06-21 ENCOUNTER — Ambulatory Visit: Payer: Medicare Other

## 2023-06-21 ENCOUNTER — Ambulatory Visit: Payer: Medicare Other | Admitting: Family Medicine

## 2023-06-21 VITALS — BP 152/100 | HR 63 | Temp 98.4°F | Ht 63.5 in | Wt 145.6 lb

## 2023-06-21 DIAGNOSIS — E782 Mixed hyperlipidemia: Secondary | ICD-10-CM

## 2023-06-21 DIAGNOSIS — M25512 Pain in left shoulder: Secondary | ICD-10-CM

## 2023-06-21 DIAGNOSIS — G8929 Other chronic pain: Secondary | ICD-10-CM

## 2023-06-21 DIAGNOSIS — Z131 Encounter for screening for diabetes mellitus: Secondary | ICD-10-CM

## 2023-06-21 DIAGNOSIS — I10 Essential (primary) hypertension: Secondary | ICD-10-CM

## 2023-06-21 LAB — HEMOGLOBIN A1C: Hgb A1c MFr Bld: 5.5 % (ref 4.6–6.5)

## 2023-06-21 LAB — COMPREHENSIVE METABOLIC PANEL
ALT: 19 U/L (ref 0–35)
AST: 25 U/L (ref 0–37)
Albumin: 4.5 g/dL (ref 3.5–5.2)
Alkaline Phosphatase: 92 U/L (ref 39–117)
BUN: 13 mg/dL (ref 6–23)
CO2: 29 mEq/L (ref 19–32)
Calcium: 9.9 mg/dL (ref 8.4–10.5)
Chloride: 96 mEq/L (ref 96–112)
Creatinine, Ser: 0.74 mg/dL (ref 0.40–1.20)
GFR: 81.8 mL/min (ref >60.00)
Glucose, Bld: 92 mg/dL (ref 70–99)
Potassium: 3.7 mEq/L (ref 3.5–5.1)
Sodium: 134 mEq/L — ABNORMAL LOW (ref 135–145)
Total Bilirubin: 1 mg/dL (ref 0.2–1.2)
Total Protein: 7.6 g/dL (ref 6.0–8.3)

## 2023-06-21 LAB — LIPID PANEL
Cholesterol: 190 mg/dL (ref 0–200)
HDL: 83.8 mg/dL (ref >39.00)
LDL Cholesterol: 91 mg/dL (ref 0–99)
NonHDL: 106.25
Total CHOL/HDL Ratio: 2
Triglycerides: 77 mg/dL (ref 0.0–149.0)
VLDL: 15.4 mg/dL (ref 0.0–40.0)

## 2023-06-21 MED ORDER — AMLODIPINE BESYLATE 5 MG PO TABS: 5.0000 mg | ORAL_TABLET | Freq: Every day | ORAL | 1 refills | Status: DC

## 2023-06-21 NOTE — Patient Instructions (Addendum)
Check BP daily at home-- your goal is anything less than 140/90  Try Beet root powder supplements also

## 2023-06-27 DIAGNOSIS — G8929 Other chronic pain: Secondary | ICD-10-CM | POA: Insufficient documentation

## 2023-06-27 NOTE — Assessment & Plan Note (Signed)
Chronic, BP is uncontrolled today, we discussed adding additional BP medication and she is agreeable, will add a small dose of amlodipine and see her back in 3 months for BP recheck. She is due for her annual blood work today, CMP ordered.

## 2023-06-27 NOTE — Assessment & Plan Note (Signed)
Will get X-rays and send her to physical therapy for a full evaluation.

## 2023-06-27 NOTE — Assessment & Plan Note (Signed)
On crestor 5 mg daily, needs new lipid panel done today for surveillance.

## 2023-07-04 ENCOUNTER — Telehealth: Payer: Self-pay | Admitting: Family Medicine

## 2023-07-04 NOTE — Telephone Encounter (Signed)
Pt called to say she will be at her PT appointment this morning from 10:30 am to 11:20 am.  Pt is asking that CMA please call her during that time frame, to go over her imaging, while she is with the Physical Therapist.

## 2023-07-04 NOTE — Telephone Encounter (Signed)
See lab test results note-patient was advised the results were not available and she would be contacted once the results were reviewed by PCP.

## 2023-07-07 NOTE — Telephone Encounter (Signed)
Pt is going out of town on sat and will be back Tuesday and would like the xray result ASAP

## 2023-07-11 NOTE — Telephone Encounter (Signed)
Patient informed of the results

## 2023-07-12 ENCOUNTER — Encounter: Payer: Self-pay | Admitting: *Deleted

## 2023-07-12 NOTE — Telephone Encounter (Signed)
Pt is calling would like jo to return her call again to go over the xray and also she would like a copy of report to be sent to physical therapy

## 2023-07-12 NOTE — Telephone Encounter (Signed)
I called the patient, she stated she was on a beach trip when I called previously and requested to discuss the results again.  Results were discussed and the patient was advised a copy will be mailed to the home address to discuss with her PT.

## 2023-07-25 ENCOUNTER — Telehealth: Payer: Self-pay | Admitting: Family Medicine

## 2023-07-25 NOTE — Telephone Encounter (Signed)
FYI Pt Has an appt with dr Everardo Pacific murphy/wainer tomorrow for her shoulder

## 2023-07-26 ENCOUNTER — Other Ambulatory Visit: Payer: Self-pay | Admitting: Orthopaedic Surgery

## 2023-07-26 DIAGNOSIS — Z01818 Encounter for other preprocedural examination: Secondary | ICD-10-CM

## 2023-08-03 ENCOUNTER — Encounter (HOSPITAL_BASED_OUTPATIENT_CLINIC_OR_DEPARTMENT_OTHER): Payer: Self-pay | Admitting: *Deleted

## 2023-08-04 ENCOUNTER — Ambulatory Visit
Admission: RE | Admit: 2023-08-04 | Discharge: 2023-08-04 | Disposition: A | Discharge status: Home / Self Care | Payer: Medicare Other | Source: Ambulatory Visit | Attending: Orthopaedic Surgery | Admitting: Orthopaedic Surgery

## 2023-08-04 ENCOUNTER — Other Ambulatory Visit: Payer: Self-pay | Admitting: Family Medicine

## 2023-08-04 DIAGNOSIS — Z01818 Encounter for other preprocedural examination: Secondary | ICD-10-CM

## 2023-08-23 ENCOUNTER — Encounter: Payer: Medicare Other | Admitting: Family Medicine

## 2023-09-14 ENCOUNTER — Encounter: Payer: Self-pay | Admitting: Family Medicine

## 2023-09-14 ENCOUNTER — Ambulatory Visit (INDEPENDENT_AMBULATORY_CARE_PROVIDER_SITE_OTHER): Payer: Medicare Other | Admitting: Family Medicine

## 2023-09-14 VITALS — BP 122/80 | HR 75 | Temp 98.2°F | Ht 63.5 in | Wt 147.4 lb

## 2023-09-14 DIAGNOSIS — I10 Essential (primary) hypertension: Secondary | ICD-10-CM

## 2023-09-14 DIAGNOSIS — R0982 Postnasal drip: Secondary | ICD-10-CM | POA: Diagnosis not present

## 2023-09-14 MED ORDER — FLUTICASONE PROPIONATE 50 MCG/ACT NA SUSP
1.0000 | Freq: Every day | NASAL | 1 refills | Status: AC
Start: 2023-09-14 — End: ?

## 2023-09-14 NOTE — Assessment & Plan Note (Signed)
Current hypertension medications:       Sig   amLODipine (NORVASC) 5 MG tablet (Taking) Take 1 tablet (5 mg total) by mouth daily.   telmisartan-hydrochlorothiazide (MICARDIS HCT) 80-25 MG tablet (Taking) Take 1 tablet by mouth daily.      Chronic, stable, BP is now well controlled on the current regimen listed above. Will continue medications as prescribed. RTC 6 months

## 2023-09-14 NOTE — Progress Notes (Signed)
Established Patient Office Visit  Subjective   Patient ID: Bonnie Moon, female    DOB: 05-02-1951  Age: 72 y.o. MRN: [id number]  Chief Complaint  Patient presents with   Medical Management of Chronic Issues   Cough    Productive with white sputum x2 weeks, has not tried any mediation and feels worse in the morning with increased congestion    Pt is here for follow up for her blood pressure.   She reports that she is taking the amlodipine with her other BP medication and she thinks it is working well. States that she saw another doctor and her BP there was 113. She denies any side effects to the medication, no dizziness or lightheadedness, no ankle swelling.   Pt states that she had a URI about 2-3 weeks ago, states that she did get better on her own, however she continues to have pressure in her ears, states that she continues to have stuffiness and is having congestion in the morning thinks that there is just lingering drainage.  Cough This is a new problem. The current episode started 1 to 4 weeks ago. The cough is Productive of sputum. She has tried nothing for the symptoms.    Current Outpatient Medications  Medication Instructions   amLODipine (NORVASC) 5 mg, Oral, Daily   Ascorbic Acid (VITAMIN C PO) 250 mg, Daily   cetirizine (ZYRTEC ALLERGY) 10 mg, Oral, Daily   famotidine (PEPCID) 20 mg, As needed   fluticasone (FLONASE) 50 MCG/ACT nasal spray 1 spray, Each Nare, Daily   Multiple Vitamins-Minerals (MULTIVITAMIN PO) Take by mouth. 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   All other systems reviewed and are negative.     Objective:     BP 122/80   Pulse 75   Temp 98.2 F (36.8 C) (Oral)   Ht 5' 3.5" (1.613 m)   Wt 147 lb 6.4 oz (66.9 kg)   LMP 07/28/2002   SpO2 99%   BMI 25.70 kg/m    Physical Exam Vitals reviewed.  HENT:     Right Ear: Tympanic membrane normal.     Ears:     Comments: Left ear effusion but no signs of infection    Mouth/Throat:     Mouth: Mucous membranes are moist.     Pharynx: No posterior oropharyngeal erythema.  Cardiovascular:     Rate and Rhythm: Normal rate and regular rhythm.     Pulses: Normal pulses.     Heart sounds: Normal heart sounds. No murmur heard. Pulmonary:     Effort: Pulmonary effort is normal.     Breath sounds: Normal breath sounds. No wheezing.  Neurological:     Mental Status:  She is oriented to person, place, and time. Mental status is at baseline.  Psychiatric:        Mood and Affect: Mood normal.        Behavior: Behavior normal.      No results found for any visits on 09/14/23.    The 10-year ASCVD risk score (Arnett DK, et al., 2019) is: 10.5%    Assessment & Plan:  Primary hypertension Assessment & Plan: Current hypertension medications:       Sig   amLODipine (NORVASC) 5 MG tablet (Taking) Take 1 tablet (5 mg total) by mouth daily.   telmisartan-hydrochlorothiazide (MICARDIS HCT) 80-25 MG tablet (Taking) Take 1 tablet by mouth daily.      Chronic, stable, BP is now well controlled on the current regimen listed above. Will continue medications as prescribed. RTC 6  months   Post-nasal drip -     Fluticasone Propionate; Place 1 spray into both nostrils daily.  Dispense: 16 g; Refill: 1   No signs of infection on exam, most likely post viral PND, will treat with flonase for the next couple of weeks to help this resolve. RTC PRN if sx do not improve or if she develops any ear or facial pain, fever or chills.   Return in about 6 months (around 03/14/2024) for HTN -- also needs a telephone medicare visit with Meriam Sprague at some point.    Karie Georges, MD

## 2023-09-19 ENCOUNTER — Telehealth: Payer: Self-pay

## 2023-09-19 MED ORDER — AZITHROMYCIN 250 MG PO TABS: ORAL_TABLET | ORAL | 0 refills | Status: AC

## 2023-09-19 NOTE — Telephone Encounter (Signed)
 Called in zithromax for her

## 2023-09-19 NOTE — Telephone Encounter (Signed)
Copied from CRM [id number]. Topic: Clinical - Prescription Issue >> Sep 19, 2023  8:43 AM Sim Boast F wrote: Reason for CRM: Patient seen Dr. Casimiro Needle 12/18 - says nasal spray is not working and was told to call back if so. Patient is coughing up green stuff and would like a anti biotic sent to pharmacy today 12/23 before the holidays.

## 2023-09-19 NOTE — Addendum Note (Signed)
Addended by: Karie Georges on: 09/19/2023 09:45 AM   Modules accepted: Orders

## 2023-09-19 NOTE — Telephone Encounter (Signed)
Left a detailed message with the information below at the patient's cell number. ?

## 2023-09-28 HISTORY — PX: REVERSE SHOULDER ARTHROPLASTY: SHX5054

## 2023-09-29 ENCOUNTER — Telehealth: Payer: Self-pay

## 2023-09-29 NOTE — Telephone Encounter (Signed)
 Spoke with the patient and informed her she needs a visit for re-eval per last note by Dr Casimiro Needle.  Offered an appt with Dr Casimiro Needle tomorrow at 10:30am, patient declined and scheduled an appt with Dr Caryl Never on 1/6.

## 2023-09-29 NOTE — Telephone Encounter (Signed)
 Copied from CRM [id number]. Topic: Clinical - Medical Advice >> Sep 26, 2023 11:31 AM Robinson H wrote: Reason for CRM: Patient states she's still having issues having stuffiness in head and around ears. Was on antibiotics for ear and cold and not getting any better. No pain, patient having surgery at the end of January needs to get this cleared up.  Maricel [phone]

## 2023-10-03 ENCOUNTER — Encounter: Payer: Self-pay | Admitting: Family Medicine

## 2023-10-03 ENCOUNTER — Ambulatory Visit (INDEPENDENT_AMBULATORY_CARE_PROVIDER_SITE_OTHER): Payer: Medicare Other | Admitting: Family Medicine

## 2023-10-03 VITALS — BP 130/70 | HR 74 | Temp 97.9°F | Ht 62.25 in | Wt 149.0 lb

## 2023-10-03 DIAGNOSIS — H6992 Unspecified Eustachian tube disorder, left ear: Secondary | ICD-10-CM

## 2023-10-03 DIAGNOSIS — J309 Allergic rhinitis, unspecified: Secondary | ICD-10-CM | POA: Diagnosis not present

## 2023-10-03 MED ORDER — AZELASTINE HCL 0.1 % NA SOLN: 1.0000 | Freq: Two times a day (BID) | NASAL | 2 refills | Status: AC

## 2023-10-03 NOTE — Progress Notes (Signed)
 Established Patient Office Visit  Subjective   Patient ID: Bonnie Moon, female    DOB: 08/19/1951  Age: 73 y.o. MRN: [id number]  Chief Complaint  Patient presents with   Ear Problem    HPI   Bonnie Moon is seen with some ongoing nasal congestion.  She states she had a cold back in November which lasted a couple weeks but she has had some persistent nasal congestion since then.  No facial pain or headache.  No purulent drainage.  Has some left ear symptoms.  She was seen here December 18 and had evidence for some effusion left ear.  Still has some fullness in the left ear since then.  No hearing change.  Has taken some Claritin without much benefit.  Still has some postnasal drip and frequent rhinorrhea.  Has been prescribed Flonase  in the past but apparently did not tolerate.  Has not tried nasal antihistamines previously.  Past Medical History:  Diagnosis Date   Allergy    GERD (gastroesophageal reflux disease)    Hyperlipidemia    IBS (irritable bowel syndrome)    Nephrolithiasis    Osteopenia    Thyroid  nodule    Tubular adenoma of colon 02/2009   Vitamin D  insufficiency    Past Surgical History:  Procedure Laterality Date   BREAST ENHANCEMENT SURGERY     COLONOSCOPY  04/11/2014   Dr.Stark   PARATHYROIDECTOMY  01/17/2017   done by Dr. Clarice- at [facility] in [place]  (1/2 parathyroid remains)   TONSILLECTOMY     TOTAL HIP ARTHROPLASTY Left 05/2008   TOTAL HIP ARTHROPLASTY Right 04/02/2020   Dr Inda patient    reports that she has never smoked. She has never used smokeless tobacco. She reports that she does not currently use alcohol. She reports that she does not use drugs. family history includes Alzheimer's disease in her father; Atrial fibrillation in her father; Heart disease in her mother; Heart failure in her mother; High Cholesterol in her daughter and mother; Osteopenia in her mother; Other in her father, mother, and sister; Thyroid  disease in her daughter; Thyroid   nodules in her brother. Allergies  Allergen Reactions   Lipitor [Atorvastatin Calcium]     Review of Systems  Constitutional:  Negative for chills and fever.  HENT:  Positive for congestion. Negative for ear discharge, hearing loss, sinus pain and tinnitus.   Respiratory:  Negative for cough.       Objective:     BP 130/70   Pulse 74   Temp 97.9 F (36.6 C) (Oral)   Ht 5' 2.25 (1.581 m)   Wt 149 lb (67.6 kg)   LMP 07/28/2002   SpO2 96%   BMI 27.03 kg/m  BP Readings from Last 3 Encounters:  10/03/23 130/70  09/14/23 122/80  06/21/23 (!) 152/100   Wt Readings from Last 3 Encounters:  10/03/23 149 lb (67.6 kg)  09/14/23 147 lb 6.4 oz (66.9 kg)  06/21/23 145 lb 9.6 oz (66 kg)      Physical Exam Vitals reviewed.  Constitutional:      General: She is not in acute distress.    Appearance: She is not ill-appearing.  HENT:     Right Ear: Tympanic membrane and ear canal normal.     Left Ear: Tympanic membrane and ear canal normal.     Mouth/Throat:     Mouth: Mucous membranes are moist.     Pharynx: Oropharynx is clear.  Cardiovascular:     Rate and Rhythm: Normal  rate and regular rhythm.  Pulmonary:     Effort: Pulmonary effort is normal.     Breath sounds: Normal breath sounds. No wheezing or rales.  Musculoskeletal:     Cervical back: Neck supple.  Lymphadenopathy:     Cervical: No cervical adenopathy.  Neurological:     Mental Status: She is alert.      No results found for any visits on 10/03/23.    The 10-year ASCVD risk score (Arnett DK, et al., 2019) is: 13.4%    Assessment & Plan:   Probable eustachian tube dysfunction left.  TMs appear normal.  No evidence for effusion or otitis media at this time.  She does relate some ongoing nasal congestion symptoms for weeks now and suspect allergy basis.  Previous intolerance with nasal steroids.  Current symptoms not relieved with Claritin.  Recommend trial of Astelin  nasal 1 to 2 sprays per nostril  twice daily as needed  Wolm Scarlet, MD

## 2023-10-20 ENCOUNTER — Other Ambulatory Visit: Payer: Self-pay

## 2023-10-20 ENCOUNTER — Encounter (HOSPITAL_BASED_OUTPATIENT_CLINIC_OR_DEPARTMENT_OTHER): Payer: Self-pay | Admitting: Orthopaedic Surgery

## 2023-10-24 ENCOUNTER — Encounter (HOSPITAL_BASED_OUTPATIENT_CLINIC_OR_DEPARTMENT_OTHER)
Admission: RE | Admit: 2023-10-24 | Discharge: 2023-10-24 | Disposition: A | Discharge status: Home / Self Care | Payer: Medicare Other | Source: Ambulatory Visit | Attending: Orthopaedic Surgery | Admitting: Orthopaedic Surgery

## 2023-10-24 DIAGNOSIS — Z01818 Encounter for other preprocedural examination: Secondary | ICD-10-CM | POA: Insufficient documentation

## 2023-10-24 LAB — BASIC METABOLIC PANEL
Anion gap: 11 (ref 5–15)
BUN: 15 mg/dL (ref 8–23)
CO2: 24 mmol/L (ref 22–32)
Calcium: 9.8 mg/dL (ref 8.9–10.3)
Chloride: 97 mmol/L — ABNORMAL LOW (ref 98–111)
Creatinine, Ser: 0.74 mg/dL (ref 0.44–1.00)
GFR, Estimated: >60 mL/min (ref >60)
Glucose, Bld: 82 mg/dL (ref 70–99)
Potassium: 4.1 mmol/L (ref 3.5–5.1)
Sodium: 132 mmol/L — ABNORMAL LOW (ref 135–145)

## 2023-10-24 LAB — SURGICAL PCR SCREEN
MRSA, PCR: NEGATIVE
Staphylococcus aureus: NEGATIVE

## 2023-10-24 NOTE — Progress Notes (Signed)
Surgical soap given with instructions, pt verbalized understanding. Benzoyl peroxide gel given with instructions, pt verbalized understanding.

## 2023-10-25 NOTE — H&P (Signed)
PREOPERATIVE H&P  Chief Complaint: left shoulder OA  HPI: Bonnie Moon is a 73 y.o. female who is scheduled for Procedure(s): REVERSE SHOULDER ARTHROPLASTY.   Patient has a past medical history significant for GERD, IBS, HTN, HLD.   The patient is a 73 year old female coming in for a consult for chronic left shoulder pain. She is a right hand dominant female with a past medical history of anxiety and hypertension that is well controlled.  She reports years of pain in the shoulder.  She has been doing some physical therapy with minor improvement.  She reports hearing cracking and popping audibly for the last year or so, and the pain has gotten to a point where she now cannot lift her hand above her head and has lost significant range of motion.  Symptoms are rated as moderate to severe, and have been worsening.  This is significantly impairing activities of daily living.    Please see clinic note for further details on this patient's care.    She has elected for surgical management.   Past Medical History:  Diagnosis Date   Allergy    Complication of anesthesia 2021   hypotension after surgery and had to stay overnight   GERD (gastroesophageal reflux disease)    Hyperlipidemia    Hypertension    IBS (irritable bowel syndrome)    Nephrolithiasis    Osteopenia    Thyroid nodule    Tubular adenoma of colon 02/2009   Vitamin D insufficiency    Past Surgical History:  Procedure Laterality Date   BREAST ENHANCEMENT SURGERY     COLONOSCOPY  04/11/2014   Dr.Stark   PARATHYROIDECTOMY  01/17/2017   done by Dr. Rochele Raring- at [facility] in [place]  (1/2 parathyroid remains)   TONSILLECTOMY     TOTAL HIP ARTHROPLASTY Left 05/2008   TOTAL HIP ARTHROPLASTY Right 04/02/2020   Dr Merlene Laughter patient   Social History   Socioeconomic History   Marital status: Widowed    Spouse name: Not on file   Number of children: Not on file   Years of education: 14   Highest education level: Not on  file  Occupational History   Occupation: Self-employed    Comment: Event and Meeting Planning  Tobacco Use   Smoking status: Never   Smokeless tobacco: Never  Vaping Use   Vaping status: Never Used  Substance and Sexual Activity   Alcohol use: Not Currently    Comment: one drink every 2-3 weeks   Drug use: No   Sexual activity: Not Currently    Partners: Male    Birth control/protection: Abstinence  Other Topics Concern   Not on file  Social History Narrative   Regular exercise-yes   Caffeine Use-yes   Lives alone   [place] [employer]   Regular exercise   Diet is good   Social Drivers of Corporate investment banker Strain: Low Risk  (10/19/2021)   Overall Financial Resource Strain (CARDIA)    Difficulty of Paying Living Expenses: Not hard at all  Food Insecurity: No Food Insecurity (08/11/2021)   Hunger Vital Sign    Worried About Running Out of Food in the Last Year: Never true    Ran Out of Food in the Last Year: Never true  Transportation Needs: No Transportation Needs (10/19/2021)   PRAPARE - Administrator, Civil Service (Medical): No    Lack of Transportation (Non-Medical): No  Physical Activity: Sufficiently Active (08/11/2021)   Exercise  Vital Sign    Days of Exercise per Week: 4 days    Minutes of Exercise per Session: 50 min  Stress: No Stress Concern Present (08/11/2021)   [employer] of Occupational Health - Occupational Stress Questionnaire    Feeling of Stress : Not at all  Social Connections: Moderately Integrated (08/11/2021)   Social Connection and Isolation Panel [NHANES]    Frequency of Communication with Friends and Family: More than three times a week    Frequency of Social Gatherings with Friends and Family: More than three times a week    Attends Religious Services: More than 4 times per year    Active Member of [employer] or Organizations: Yes    Attends Banker Meetings: More than 4 times per year     Marital Status: Widowed   Family History  Problem Relation Age of Onset   Osteopenia Mother    Heart disease Mother        a. fib   High Cholesterol Mother    Heart failure Mother        pacemaker   Other Mother        parathyroid disease   Atrial fibrillation Father    Alzheimer's disease Father    Other Father        pacemaker   Other Sister        parathyroid   Thyroid nodules Brother    Thyroid disease Daughter        nodule, cancer   High Cholesterol Daughter    Colon cancer Neg Hx    Pancreatic cancer Neg Hx    Rectal cancer Neg Hx    Stomach cancer Neg Hx    Esophageal cancer Neg Hx    Colon polyps Neg Hx    Allergies  Allergen Reactions   Lipitor [Atorvastatin Calcium] Other (See Comments)   Prior to Admission medications   Medication Sig Start Date End Date Taking? Authorizing Provider  amLODipine (NORVASC) 5 MG tablet Take 1 tablet (5 mg total) by mouth daily. 06/21/23  Yes Karie Georges, MD  cetirizine (ZYRTEC ALLERGY) 10 MG tablet Take 1 tablet (10 mg total) by mouth daily. 05/04/21  Yes Koberlein, Junell C, MD  fluticasone (FLONASE) 50 MCG/ACT nasal spray Place 1 spray into both nostrils daily. 09/14/23  Yes Karie Georges, MD  Multiple Vitamins-Minerals (MULTIVITAMIN PO) Take by mouth. gummies 2 daily   Yes [provider]  rosuvastatin (CRESTOR) 5 MG tablet TAKE 1 TABLET BY MOUTH EVERY DAY 08/04/23  Yes Karie Georges, MD  telmisartan-hydrochlorothiazide (MICARDIS HCT) 80-25 MG tablet Take 1 tablet by mouth daily. 06/09/23  Yes Karie Georges, MD  Ascorbic Acid (VITAMIN C PO) Take 250 mg by mouth daily.    [provider]  azelastine (ASTELIN) 0.1 % nasal spray Place 1 spray into both nostrils 2 (two) times daily. Use in each nostril as directed 10/03/23   Burchette, Elberta Fortis, MD  Cholecalciferol (VITAMIN D-3) 1000 UNITS CAPS Take 2,000 Units by mouth.    [provider]  famotidine (PEPCID) 20 MG tablet Take 20 mg by  mouth as needed for heartburn or indigestion.    [provider]  valACYclovir (VALTREX) 1000 MG tablet TAKE TWO TABLETS BY MOUTH AS NEEDED WITH ONSET OF COLD SORES AND REPEAT TWO IN 12 HOURS 09/16/22   Karie Georges, MD    ROS: All other systems have been reviewed and were otherwise negative with the exception of those mentioned  in the HPI and as above.  Physical Exam: General: Alert, no acute distress Cardiovascular: No pedal edema Respiratory: No cyanosis, no use of accessory musculature GI: No organomegaly, abdomen is soft and non-tender Skin: No lesions in the area of chief complaint Neurologic: Sensation intact distally Psychiatric: Patient is competent for consent with normal mood and affect Lymphatic: No axillary or cervical lymphadenopathy  MUSCULOSKELETAL:  On exam of the left shoulder, she is tender to palpation at the [facility] joint and bicipital groove.  She has audible crepitus with any range of motion.  Active forward flexion of the right side is 160 degrees; active forward flexion of the left is 130 degrees with firm stop; external rotation of the right side is 80; external rotation of the left is 0.  She is able to internally rotate bilaterally to T12.  On exam, she has weakness in the rotator cuff specifically within the infraspinatus and supraspinatus.  It appears her subscapularis is intact.   Imaging: Three views of the left shoulder show end-stage glenohumeral arthritis with posterior erosion of the glenoid. She does have an inferior humeral head osteophyte that is quite large.  She has AC joint arthrosis and changes along the humeral head consistent with chronic rotator cuff tears.  These x-rays were ordered and reviewed by me.   BMI: Estimated body mass index is 25.69 kg/m as calculated from the following:   Height as of this encounter: 5\' 3"  (1.6 m).   Weight as of this encounter: 65.8 kg.  Lab Results  Component Value Date   ALBUMIN 4.5 06/21/2023    Diabetes: Patient does not have a diagnosis of diabetes. Lab Results  Component Value Date   HGBA1C 5.5 06/21/2023     Smoking Status:   reports that she has never smoked. She has never used smokeless tobacco.     Assessment: left shoulder OA  Plan: Plan for Procedure(s): REVERSE SHOULDER ARTHROPLASTY  The risks benefits and alternatives were discussed with the patient including but not limited to the risks of nonoperative treatment, versus surgical intervention including infection, bleeding, nerve injury,  blood clots, cardiopulmonary complications, morbidity, mortality, among others, and they were willing to proceed.   We additionally specifically discussed risks of axillary nerve injury, infection, periprosthetic fracture, continued pain and longevity of implants prior to beginning procedure.    Patient will be closely monitored in PACU for medical stabilization and pain control. If found stable in PACU, patient may be discharged home with outpatient follow-up. If any concerns regarding patient's stabilization patient will be admitted for observation after surgery. The patient is planning to be discharged home with outpatient PT.   The patient acknowledged the explanation, agreed to proceed with the plan and consent was signed.   Operative Plan: Left reverse total shoulder arthroplasty Discharge Medications: standard DVT Prophylaxis: aspirin Physical Therapy: outpatient PT Special Discharge needs: Sling (should bring with her). IceMan   Vernetta Honey, PA-C  10/25/2023 4:00 PM

## 2023-10-26 NOTE — Discharge Instructions (Signed)
Ramond Marrow MD, MPH Alfonse Alpers, PA-C [facility] [address] [phone] (tel)   [phone] (fax)   POST-OPERATIVE INSTRUCTIONS - TOTAL SHOULDER REPLACEMENT    WOUND CARE You may leave the operative dressing in place until your follow-up appointment. KEEP THE INCISIONS CLEAN AND DRY. There may be a small amount of fluid/bleeding leaking at the surgical site. This is normal after surgery.  If it fills with liquid or blood please call us immediately to change it for you. Use the provided ice machine or Ice packs as often as possible for the first 3-4 days, then as needed for pain relief.   Keep a layer of cloth or a shirt between your skin and the cooling unit to prevent frost bite as it can get very cold.  SHOWERING: - You may shower on Post-Op Day #2.  - The dressing is water resistant but do not scrub it as it may start to peel up.   - You may remove the sling for showering - Gently pat the area dry.  - Do not soak the shoulder in water.  - Do not go swimming in the pool or ocean until your incision has completely healed (about 4-6 weeks after surgery) - KEEP THE INCISIONS CLEAN AND DRY.  EXERCISES Wear the sling at all times  You may remove the sling for showering, but keep the arm across the chest or in a secondary sling.    Accidental/Purposeful External Rotation and shoulder flexion (reaching behind you) is to be avoided at all costs for the first month. It is ok to come out of your sling if your are sitting and have assistance for eating.   Do not lift anything heavier than 1 pound until we discuss it further in clinic.  It is normal for your fingers/hand to become more swollen after surgery and discolored from bruising.   This will resolve over the first few weeks usually after surgery. Please continue to ambulate and do not stay sitting or lying for too long.  Perform foot and wrist pumps to assist in circulation.  PHYSICAL  THERAPY - You will begin physical therapy soon after surgery (unless otherwise specified) - Please call to set up an appointment, if you do not already have one  - Let our office if there are any issues with scheduling your therapy   - A PT referral was sent to [facility] in [facility]   REGIONAL ANESTHESIA (NERVE BLOCKS) The anesthesia team may have performed a nerve block for you this is a great tool used to minimize pain.   The block may start wearing off overnight (between 8-24 hours postop) When the block wears off, your pain may go from nearly zero to the pain you would have had postop without the block. This is an abrupt transition but nothing dangerous is happening.   This can be a challenging period but utilize your as needed pain medications to try and manage this period. We suggest you use the pain medication the first night prior to going to bed, to ease this transition.  You may take an extra dose of narcotic when this happens if needed   POST-OP MEDICATIONS- Multimodal approach to pain control In general your pain will be controlled with a combination of substances.  Prescriptions unless otherwise discussed are electronically sent to your pharmacy.  This is a carefully made plan we use to minimize narcotic use.     Celebrex - Anti-inflammatory medication taken  on a scheduled basis Acetaminophen - Non-narcotic pain medicine taken on a scheduled basis  Oxycodone - This is a strong narcotic, to be used only on an "as needed" basis for SEVERE pain. Aspirin 81mg  - This medicine is used to minimize the risk of blood clots after surgery. Omeprazole - daily medicine to protect your stomach while taking anti-inflammatories.   Zofran -  take as needed for nausea   FOLLOW-UP If you develop a Fever (>101.5), Redness or Drainage from the surgical incision site, please call our office to arrange for an evaluation. Please call the office to schedule a follow-up appointment for a wound  check, 7-10 days post-operatively.  IF YOU HAVE ANY QUESTIONS, PLEASE FEEL FREE TO CALL OUR OFFICE.  HELPFUL INFORMATION  Your arm will be in a sling following surgery. You will be in this sling for the next 4 weeks.   You may be more comfortable sleeping in a semi-seated position the first few nights following surgery.  Keep a pillow propped under the elbow and forearm for comfort.  If you have a recliner type of chair it might be beneficial.  If not that is fine too, but it would be helpful to sleep propped up with pillows behind your operated shoulder as well under your elbow and forearm.  This will reduce pulling on the suture lines.  When dressing, put your operative arm in the sleeve first.  When getting undressed, take your operative arm out last.  Loose fitting, button-down shirts are recommended.  In most states it is against the law to drive while your arm is in a sling. And certainly against the law to drive while taking narcotics.  You may return to work/school in the next couple of days when you feel up to it. Desk work and typing in the sling is fine.  We suggest you use the pain medication the first night prior to going to bed, in order to ease any pain when the anesthesia wears off. You should avoid taking pain medications on an empty stomach as it will make you nauseous.  You should wean off your narcotic medicines as soon as you are able.     Most patients will be off narcotics before their first postop appointment.   Do not drink alcoholic beverages or take illicit drugs when taking pain medications.  Pain medication may make you constipated.  Below are a few solutions to try in this order: Decrease the amount of pain medication if you aren't having pain. Drink lots of decaffeinated fluids. Drink prune juice and/or each dried prunes  If the first 3 don't work start with additional solutions Take Colace - an over-the-counter stool softener Take Senokot - an  over-the-counter laxative Take Miralax - a stronger over-the-counter laxative   Dental Antibiotics:  In most cases prophylactic antibiotics for Dental procdeures after total joint surgery are not necessary.  Exceptions are as follows:  1. History of prior total joint infection  2. Severely immunocompromised (Organ Transplant, cancer chemotherapy, Rheumatoid biologic meds such as Humira)  3. Poorly controlled diabetes (A1C &gt; 8.0, blood glucose over 200)  If you have one of these conditions, contact your surgeon for an antibiotic prescription, prior to your dental procedure.   For more information including helpful videos and documents visit our website:   [email or web address]

## 2023-10-27 ENCOUNTER — Encounter (HOSPITAL_BASED_OUTPATIENT_CLINIC_OR_DEPARTMENT_OTHER)
Admission: RE | Disposition: A | Discharge status: Home / Self Care | Payer: Self-pay | Source: Home / Self Care | Attending: Orthopaedic Surgery

## 2023-10-27 ENCOUNTER — Ambulatory Visit (HOSPITAL_BASED_OUTPATIENT_CLINIC_OR_DEPARTMENT_OTHER): Payer: Medicare Other | Admitting: Certified Registered Nurse Anesthetist

## 2023-10-27 ENCOUNTER — Ambulatory Visit (HOSPITAL_COMMUNITY): Payer: Medicare Other

## 2023-10-27 ENCOUNTER — Other Ambulatory Visit: Payer: Self-pay

## 2023-10-27 ENCOUNTER — Ambulatory Visit (HOSPITAL_BASED_OUTPATIENT_CLINIC_OR_DEPARTMENT_OTHER)
Admission: RE | Admit: 2023-10-27 | Discharge: 2023-10-27 | Disposition: A | Discharge status: Home / Self Care | Payer: Medicare Other | Attending: Orthopaedic Surgery | Admitting: Orthopaedic Surgery

## 2023-10-27 DIAGNOSIS — Z79899 Other long term (current) drug therapy: Secondary | ICD-10-CM | POA: Diagnosis not present

## 2023-10-27 DIAGNOSIS — M19012 Primary osteoarthritis, left shoulder: Secondary | ICD-10-CM | POA: Insufficient documentation

## 2023-10-27 DIAGNOSIS — K219 Gastro-esophageal reflux disease without esophagitis: Secondary | ICD-10-CM | POA: Insufficient documentation

## 2023-10-27 DIAGNOSIS — F418 Other specified anxiety disorders: Secondary | ICD-10-CM

## 2023-10-27 DIAGNOSIS — M25712 Osteophyte, left shoulder: Secondary | ICD-10-CM | POA: Insufficient documentation

## 2023-10-27 DIAGNOSIS — I10 Essential (primary) hypertension: Secondary | ICD-10-CM

## 2023-10-27 DIAGNOSIS — Z01818 Encounter for other preprocedural examination: Secondary | ICD-10-CM

## 2023-10-27 DIAGNOSIS — Z8249 Family history of ischemic heart disease and other diseases of the circulatory system: Secondary | ICD-10-CM | POA: Diagnosis not present

## 2023-10-27 HISTORY — PX: REVERSE SHOULDER ARTHROPLASTY: SHX5054

## 2023-10-27 HISTORY — DX: Essential (primary) hypertension: I10

## 2023-10-27 SURGERY — ARTHROPLASTY, SHOULDER, TOTAL, REVERSE
Anesthesia: Regional | Site: Shoulder | Laterality: Left

## 2023-10-27 MED ORDER — HYDROMORPHONE HCL 1 MG/ML IJ SOLN
INTRAMUSCULAR | Status: AC
  Filled 2023-10-27: qty 0.5

## 2023-10-27 MED ORDER — CEFAZOLIN SODIUM-DEXTROSE 2-4 GM/100ML-% IV SOLN
INTRAVENOUS | Status: AC
  Filled 2023-10-27: qty 100

## 2023-10-27 MED ORDER — LACTATED RINGERS IV SOLN: INTRAVENOUS | Status: DC

## 2023-10-27 MED ORDER — PHENYLEPHRINE HCL-NACL 20-0.9 MG/250ML-% IV SOLN
INTRAVENOUS | Status: DC | PRN
  Administered 2023-10-27: 25 ug/min via INTRAVENOUS

## 2023-10-27 MED ORDER — TRANEXAMIC ACID-NACL 1000-0.7 MG/100ML-% IV SOLN
1000.0000 mg | INTRAVENOUS | Status: AC
  Administered 2023-10-27: 1000 mg via INTRAVENOUS

## 2023-10-27 MED ORDER — MIDAZOLAM HCL 2 MG/2ML IJ SOLN
2.0000 mg | Freq: Once | INTRAMUSCULAR | Status: AC
  Administered 2023-10-27: 1 mg via INTRAVENOUS

## 2023-10-27 MED ORDER — ACETAMINOPHEN 500 MG PO TABS
ORAL_TABLET | ORAL | Status: AC
  Filled 2023-10-27: qty 2

## 2023-10-27 MED ORDER — FENTANYL CITRATE (PF) 100 MCG/2ML IJ SOLN
100.0000 ug | Freq: Once | INTRAMUSCULAR | Status: AC
  Administered 2023-10-27: 50 ug via INTRAVENOUS

## 2023-10-27 MED ORDER — ONDANSETRON HCL 4 MG/2ML IJ SOLN
INTRAMUSCULAR | Status: DC | PRN
  Administered 2023-10-27: 4 mg via INTRAVENOUS

## 2023-10-27 MED ORDER — DEXAMETHASONE SODIUM PHOSPHATE 10 MG/ML IJ SOLN
INTRAMUSCULAR | Status: DC | PRN
  Administered 2023-10-27: 10 mg via INTRAVENOUS

## 2023-10-27 MED ORDER — ACETAMINOPHEN 500 MG PO TABS: 1000.0000 mg | ORAL_TABLET | Freq: Three times a day (TID) | ORAL | 0 refills | Status: AC

## 2023-10-27 MED ORDER — CEFAZOLIN SODIUM-DEXTROSE 2-4 GM/100ML-% IV SOLN: 2.0000 g | INTRAVENOUS | Status: DC

## 2023-10-27 MED ORDER — ALBUMIN HUMAN 5 % IV SOLN: INTRAVENOUS | Status: DC | PRN

## 2023-10-27 MED ORDER — GABAPENTIN 300 MG PO CAPS
300.0000 mg | ORAL_CAPSULE | Freq: Once | ORAL | Status: AC
  Administered 2023-10-27: 300 mg via ORAL

## 2023-10-27 MED ORDER — LIDOCAINE 2% (20 MG/ML) 5 ML SYRINGE
INTRAMUSCULAR | Status: DC | PRN
  Administered 2023-10-27: 40 mg via INTRAVENOUS

## 2023-10-27 MED ORDER — BUPIVACAINE HCL (PF) 0.5 % IJ SOLN
INTRAMUSCULAR | Status: DC | PRN
  Administered 2023-10-27: 10 mL via PERINEURAL

## 2023-10-27 MED ORDER — ACETAMINOPHEN 500 MG PO TABS
1000.0000 mg | ORAL_TABLET | Freq: Once | ORAL | Status: AC
  Administered 2023-10-27: 1000 mg via ORAL

## 2023-10-27 MED ORDER — VANCOMYCIN HCL 1000 MG IV SOLR
INTRAVENOUS | Status: AC
  Filled 2023-10-27: qty 20

## 2023-10-27 MED ORDER — GABAPENTIN 300 MG PO CAPS
ORAL_CAPSULE | ORAL | Status: AC
  Filled 2023-10-27: qty 1

## 2023-10-27 MED ORDER — BUPIVACAINE LIPOSOME 1.3 % IJ SUSP
INTRAMUSCULAR | Status: DC | PRN
  Administered 2023-10-27: 10 mL via PERINEURAL

## 2023-10-27 MED ORDER — PROPOFOL 500 MG/50ML IV EMUL
INTRAVENOUS | Status: AC
  Filled 2023-10-27: qty 50

## 2023-10-27 MED ORDER — EPHEDRINE SULFATE-NACL 50-0.9 MG/10ML-% IV SOSY
PREFILLED_SYRINGE | INTRAVENOUS | Status: DC | PRN
  Administered 2023-10-27: 10 mg via INTRAVENOUS
  Administered 2023-10-27 (×2): 5 mg via INTRAVENOUS

## 2023-10-27 MED ORDER — MIDAZOLAM HCL 2 MG/2ML IJ SOLN
INTRAMUSCULAR | Status: AC
  Filled 2023-10-27: qty 2

## 2023-10-27 MED ORDER — OXYCODONE HCL 5 MG PO TABS: ORAL_TABLET | ORAL | 0 refills | Status: AC

## 2023-10-27 MED ORDER — ASPIRIN 81 MG PO CHEW: 81.0000 mg | CHEWABLE_TABLET | Freq: Two times a day (BID) | ORAL | 0 refills | Status: AC

## 2023-10-27 MED ORDER — TRANEXAMIC ACID-NACL 1000-0.7 MG/100ML-% IV SOLN
INTRAVENOUS | Status: AC
  Filled 2023-10-27: qty 100

## 2023-10-27 MED ORDER — ROCURONIUM BROMIDE 10 MG/ML (PF) SYRINGE
PREFILLED_SYRINGE | INTRAVENOUS | Status: DC | PRN
  Administered 2023-10-27: 50 mg via INTRAVENOUS

## 2023-10-27 MED ORDER — OXYCODONE HCL 5 MG/5ML PO SOLN: 5.0000 mg | Freq: Once | ORAL | Status: DC | PRN

## 2023-10-27 MED ORDER — CELECOXIB 100 MG PO CAPS: 100.0000 mg | ORAL_CAPSULE | Freq: Two times a day (BID) | ORAL | 0 refills | Status: AC

## 2023-10-27 MED ORDER — SUGAMMADEX SODIUM 200 MG/2ML IV SOLN
INTRAVENOUS | Status: DC | PRN
  Administered 2023-10-27: 200 mg via INTRAVENOUS

## 2023-10-27 MED ORDER — FENTANYL CITRATE (PF) 100 MCG/2ML IJ SOLN
INTRAMUSCULAR | Status: AC
  Filled 2023-10-27: qty 2

## 2023-10-27 MED ORDER — CEFAZOLIN SODIUM-DEXTROSE 2-3 GM-%(50ML) IV SOLR
INTRAVENOUS | Status: DC | PRN
  Administered 2023-10-27: 2 g via INTRAVENOUS

## 2023-10-27 MED ORDER — PHENYLEPHRINE 80 MCG/ML (10ML) SYRINGE FOR IV PUSH (FOR BLOOD PRESSURE SUPPORT)
PREFILLED_SYRINGE | INTRAVENOUS | Status: DC | PRN
  Administered 2023-10-27: 160 ug via INTRAVENOUS

## 2023-10-27 MED ORDER — OXYCODONE HCL 5 MG PO TABS: 5.0000 mg | ORAL_TABLET | Freq: Once | ORAL | Status: DC | PRN

## 2023-10-27 MED ORDER — SODIUM CHLORIDE 0.9 % IV SOLN: INTRAVENOUS | Status: DC | PRN

## 2023-10-27 MED ORDER — PROPOFOL 10 MG/ML IV BOLUS
INTRAVENOUS | Status: DC | PRN
  Administered 2023-10-27: 110 mg via INTRAVENOUS

## 2023-10-27 MED ORDER — HYDROMORPHONE HCL 1 MG/ML IJ SOLN
0.2500 mg | INTRAMUSCULAR | Status: DC | PRN
  Administered 2023-10-27: 0.5 mg via INTRAVENOUS

## 2023-10-27 MED ORDER — 0.9 % SODIUM CHLORIDE (POUR BTL) OPTIME
TOPICAL | Status: DC | PRN
  Administered 2023-10-27: 1000 mL

## 2023-10-27 MED ORDER — OMEPRAZOLE 20 MG PO CPDR: 20.0000 mg | DELAYED_RELEASE_CAPSULE | Freq: Every day | ORAL | 0 refills | Status: AC

## 2023-10-27 MED ORDER — ONDANSETRON HCL 4 MG PO TABS: 4.0000 mg | ORAL_TABLET | Freq: Three times a day (TID) | ORAL | 0 refills | Status: AC | PRN

## 2023-10-27 MED ORDER — VANCOMYCIN HCL 1000 MG IV SOLR
INTRAVENOUS | Status: DC | PRN
  Administered 2023-10-27: 1000 mg via TOPICAL

## 2023-10-27 SURGICAL SUPPLY — 62 items
BIT DRILL 3.2 PERIPHERAL SCREW (BIT) IMPLANT
BLADE SAW SGTL 73X25 THK (BLADE) ×1 IMPLANT
BLADE SURG 10 STRL SS (BLADE) IMPLANT
BLADE SURG 15 STRL LF DISP TIS (BLADE) IMPLANT
BRUSH SCRUB EZ PLAIN DRY (MISCELLANEOUS) ×1 IMPLANT
CHLORAPREP W/TINT 26 (MISCELLANEOUS) ×1 IMPLANT
CLSR STERI-STRIP ANTIMIC 1/2X4 (GAUZE/BANDAGES/DRESSINGS) ×1 IMPLANT
COMP GLENOID PRIM REV 25X25 (Shoulder) ×1 IMPLANT
COMPONENT GLEND PRIM REV 25X25 (Shoulder) IMPLANT
COOLER ICEMAN CLASSIC (MISCELLANEOUS) ×1 IMPLANT
COVER BACK TABLE 60X90IN (DRAPES) ×1 IMPLANT
COVER MAYO STAND STRL (DRAPES) ×1 IMPLANT
CUP HUM REV INSERT SZ3/4 36 (Orthopedic Implant) IMPLANT
DRAPE IMP U-DRAPE 54X76 (DRAPES) IMPLANT
DRAPE INCISE IOBAN 66X45 STRL (DRAPES) ×1 IMPLANT
DRAPE POUCH INSTRU U-SHP 10X18 (DRAPES) ×1 IMPLANT
DRAPE U-SHAPE 76X120 STRL (DRAPES) ×2 IMPLANT
DRSG AQUACEL AG ADV 3.5X 6 (GAUZE/BANDAGES/DRESSINGS) ×1 IMPLANT
ELECT BLADE 4.0 EZ CLEAN MEGAD (MISCELLANEOUS) ×1
ELECT REM PT RETURN 9FT ADLT (ELECTROSURGICAL) ×1
ELECTRODE BLDE 4.0 EZ CLN MEGD (MISCELLANEOUS) ×1 IMPLANT
ELECTRODE REM PT RTRN 9FT ADLT (ELECTROSURGICAL) ×1 IMPLANT
FACESHIELD WRAPAROUND (MASK) ×2
FACESHIELD WRAPAROUND OR TEAM (MASK) ×2 IMPLANT
GAUZE XEROFORM 1X8 LF (GAUZE/BANDAGES/DRESSINGS) IMPLANT
GLENOSPHERE REV SHOULDER 36 (Joint) IMPLANT
GLOVE BIO SURGEON STRL SZ 6.5 (GLOVE) ×2 IMPLANT
GLOVE BIOGEL PI IND STRL 6.5 (GLOVE) ×1 IMPLANT
GLOVE BIOGEL PI IND STRL 8 (GLOVE) ×1 IMPLANT
GLOVE ECLIPSE 8.0 STRL XLNG CF (GLOVE) ×2 IMPLANT
GOWN STRL REUS W/ TWL LRG LVL3 (GOWN DISPOSABLE) ×2 IMPLANT
GOWN STRL REUS W/TWL XL LVL3 (GOWN DISPOSABLE) ×1 IMPLANT
GUIDE GLENOID PRIM REV 25 DISP (ORTHOPEDIC DISPOSABLE SUPPLIES) IMPLANT
GUIDE PIN 3X75 SHOULDER (PIN) ×1
GUIDEWIRE GLENOID 2.5X220 (WIRE) IMPLANT
KIT STABILIZATION SHOULDER (MISCELLANEOUS) ×1 IMPLANT
MANIFOLD NEPTUNE II (INSTRUMENTS) ×1 IMPLANT
MODEL PRIM REV SHLD DISP (ORTHOPEDIC DISPOSABLE SUPPLIES) IMPLANT
PACK BASIN DAY SURGERY FS (CUSTOM PROCEDURE TRAY) ×1 IMPLANT
PACK SHOULDER (CUSTOM PROCEDURE TRAY) ×1 IMPLANT
PAD COLD SHLDR WRAP-ON (PAD) ×1 IMPLANT
PIN GUIDE 3X75 SHOULDER (PIN) IMPLANT
RESTRAINT HEAD UNIVERSAL NS (MISCELLANEOUS) ×1 IMPLANT
SCREW 5.0X38 SMALL F/PERFORM (Screw) IMPLANT
SCREW 5.5X22 (Screw) IMPLANT
SCREW 5.5X26 (Screw) IMPLANT
SET HNDPC FAN SPRY TIP SCT (DISPOSABLE) ×1 IMPLANT
SHEET MEDIUM DRAPE 40X70 STRL (DRAPES) ×1 IMPLANT
SLEEVE SCD COMPRESS KNEE MED (STOCKING) ×1 IMPLANT
SPIKE FLUID TRANSFER (MISCELLANEOUS) IMPLANT
SPONGE T-LAP 18X18 ~~LOC~~+RFID (SPONGE) ×1 IMPLANT
STEM HUM PERFORM 3+ (Stem) IMPLANT
SUT ETHIBOND 2 V 37 (SUTURE) ×1 IMPLANT
SUT ETHIBOND NAB CT1 #1 30IN (SUTURE) ×1 IMPLANT
SUT ETHILON 3 0 PS 1 (SUTURE) IMPLANT
SUT FIBERWIRE #5 38 CONV NDL (SUTURE) ×2
SUT MNCRL AB 4-0 PS2 18 (SUTURE) ×1 IMPLANT
SUT VIC AB 0 CT1 27XBRD ANBCTR (SUTURE) IMPLANT
SUT VIC AB 3-0 SH 27X BRD (SUTURE) ×1 IMPLANT
SUTURE FIBERWR #5 38 CONV NDL (SUTURE) ×2 IMPLANT
TOWEL GREEN STERILE FF (TOWEL DISPOSABLE) ×3 IMPLANT
TUBE SUCTION HIGH CAP CLEAR NV (SUCTIONS) ×1 IMPLANT

## 2023-10-27 NOTE — Progress Notes (Signed)
Nasal betadine swabs completed in each nare prior to surgery

## 2023-10-27 NOTE — Anesthesia Procedure Notes (Signed)
Anesthesia Regional Block: Interscalene brachial plexus block   Pre-Anesthetic Checklist: , timeout performed,  Correct Patient, Correct Site, Correct Laterality,  Correct Procedure, Correct Position, site marked,  Risks and benefits discussed,  Surgical consent,  Pre-op evaluation,  At surgeon's request and post-op pain management  Laterality: Left  Prep: Dura Prep       Needles:  Injection technique: Single-shot  Needle Type: Echogenic Stimulator Needle     Needle Length: 5cm  Needle Gauge: 20     Additional Needles:   Procedures:,,,, ultrasound used (permanent image in chart),,    Narrative:  Start time: 10/27/2023 7:20 AM End time: 10/27/2023 7:25 AM Injection made incrementally with aspirations every 5 mL.  Performed by: Personally  Anesthesiologist: Atilano Median, DO  Additional Notes: Patient identified. Risks/Benefits/Options discussed with patient including but not limited to bleeding, infection, nerve damage, failed block, incomplete pain control. Patient expressed understanding and wished to proceed. All questions were answered. Sterile technique was used throughout the entire procedure. Please see nursing notes for vital signs. Aspirated in 5cc intervals with injection for negative confirmation. Patient was given instructions on fall risk and not to get out of bed. All questions and concerns addressed with instructions to call with any issues or inadequate analgesia.

## 2023-10-27 NOTE — Anesthesia Postprocedure Evaluation (Signed)
Anesthesia Post Note  Patient: MARKI FREDE  Procedure(s) Performed: REVERSE SHOULDER ARTHROPLASTY (Left: Shoulder)     Patient location during evaluation: PACU Anesthesia Type: Regional and General Level of consciousness: awake and alert Pain management: pain level controlled Vital Signs Assessment: post-procedure vital signs reviewed and stable Respiratory status: spontaneous breathing, nonlabored ventilation, respiratory function stable and patient connected to nasal cannula oxygen Cardiovascular status: blood pressure returned to baseline and stable Postop Assessment: no apparent nausea or vomiting Anesthetic complications: no   No notable events documented.  Last Vitals:  Vitals:   10/27/23 1035 10/27/23 1042  BP: (!) 86/56 (!) 90/54  Pulse:    Resp:    Temp:    SpO2:      Last Pain:  Vitals:   10/27/23 0958  TempSrc:   PainSc: 0-No pain                 Nelle Don Kalil Woessner

## 2023-10-27 NOTE — Interval H&P Note (Signed)
All questions answered, patient wants to proceed with procedure. ? ?

## 2023-10-27 NOTE — Anesthesia Procedure Notes (Signed)
Procedure Name: Intubation Date/Time: 10/27/2023 7:52 AM  Performed by: Alvera Novel, CRNAPre-anesthesia Checklist: Patient identified, Emergency Drugs available, Suction available and Patient being monitored Patient Re-evaluated:Patient Re-evaluated prior to induction Oxygen Delivery Method: Circle System Utilized Preoxygenation: Pre-oxygenation with 100% oxygen Induction Type: IV induction Ventilation: Mask ventilation without difficulty Laryngoscope Size: Mac and 3 Grade View: Grade I Tube type: Oral Tube size: 7.0 mm Number of attempts: 1 Airway Equipment and Method: Stylet and Oral airway Placement Confirmation: ETT inserted through vocal cords under direct vision, positive ETCO2 and breath sounds checked- equal and bilateral Secured at: 22 cm Tube secured with: Tape Dental Injury: Teeth and Oropharynx as per pre-operative assessment

## 2023-10-27 NOTE — Progress Notes (Signed)
Assisted Dr. Gavin Potters with left, ultrasound guided block. Side rails up, monitors on throughout procedure. See vital signs in flow sheet. Tolerated Procedure well.

## 2023-10-27 NOTE — Transfer of Care (Signed)
Immediate Anesthesia Transfer of Care Note  Patient: Bonnie Moon  Procedure(s) Performed: REVERSE SHOULDER ARTHROPLASTY (Left: Shoulder)  Patient Location: PACU  Anesthesia Type:General  Level of Consciousness: awake, alert , and oriented  Airway & Oxygen Therapy: Patient Spontanous Breathing and Patient connected to face mask oxygen  Post-op Assessment: Report given to RN and Post -op Vital signs reviewed and stable  Post vital signs: Reviewed and stable  Last Vitals:  Vitals Value Taken Time  BP 92/59 10/27/23 0908  Temp 36.3 C 10/27/23 0909  Pulse 86 10/27/23 0913  Resp 13 10/27/23 0913  SpO2 95 % 10/27/23 0913  Vitals shown include unfiled device data.  Last Pain:  Vitals:   10/27/23 0909  TempSrc:   PainSc: 0-No pain      Patients Stated Pain Goal: 5 (10/27/23 1610)  Complications: No notable events documented.

## 2023-10-27 NOTE — Anesthesia Preprocedure Evaluation (Signed)
Anesthesia Evaluation  Patient identified by MRN, date of birth, ID band Patient awake    Reviewed: Allergy & Precautions, NPO status , Patient's Chart, lab work & pertinent test results  Airway Mallampati: II  TM Distance: >3 FB Neck ROM: Full    Dental no notable dental hx.    Pulmonary neg pulmonary ROS   Pulmonary exam normal        Cardiovascular hypertension, Pt. on medications  Rhythm:Regular Rate:Normal     Neuro/Psych   Anxiety     negative neurological ROS     GI/Hepatic Neg liver ROS,GERD  ,,  Endo/Other  negative endocrine ROS    Renal/GU      Musculoskeletal  (+) Arthritis , Osteoarthritis,    Abdominal Normal abdominal exam  (+)   Peds  Hematology   Anesthesia Other Findings   Reproductive/Obstetrics                             Anesthesia Physical Anesthesia Plan  ASA: 2  Anesthesia Plan: General and Regional   Post-op Pain Management: Regional block*, Gabapentin PO (pre-op)* and Tylenol PO (pre-op)*   Induction: Intravenous  PONV Risk Score and Plan: 3 and Ondansetron, Dexamethasone and Treatment may vary due to age or medical condition  Airway Management Planned: Mask and Oral ETT  Additional Equipment: None  Intra-op Plan:   Post-operative Plan: Extubation in OR  Informed Consent: I have reviewed the patients History and Physical, chart, labs and discussed the procedure including the risks, benefits and alternatives for the proposed anesthesia with the patient or authorized representative who has indicated his/her understanding and acceptance.     Dental advisory given  Plan Discussed with: CRNA  Anesthesia Plan Comments:        Anesthesia Quick Evaluation

## 2023-10-27 NOTE — Op Note (Signed)
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  Patient had nearly 55 degrees of retroversion preoperatively and significant glenoid dysplasia.  Based on this standard implantation was templated but would not fit.  We instead used patient-specific instrumentation to recreate her glenoid while preserving her bone stock.  Implant fit was ideal.  We were in able to recreate her version to under 10 degrees of retroversion.  Stability of the construct was quite good.  Patient will move slowly with therapy for the first 4 weeks however can follow protocol otherwise.  Post-operative plan: The patient will be NWB in sling.  The patient will be will be discharged from PACU if continues to be stable as was plan prior to surgery.  DVT prophylaxis Aspirin 81 mg twice daily for 6 weeks.  Pain control with PRN pain medication preferring oral medicines.  Follow up plan will be scheduled in approximately 7 days for incision check and XR.  Physical therapy to start after 4 weeks but can do a training session in the first month if necessary.  Implants: Tornier perform humeral patient-specific instrumentation glenoid baseplate with a 25 mm center post, 4 peripheral locking screws, 36 standard glenosphere, size 3+ stem with a +3 retentive polyethylene  Post-Op Diagnosis: Same Surgeons:Primary: Bjorn Pippin, MD Assistants:Caroline McBane, PA-C Location: [facility] OR ROOM 1 Anesthesia: General with Exparel Interscalene Antibiotics: Ancef 2g preop, Vancomycin 1000mg  locally Tourniquet time: None Estimated Blood Loss: 100 Complications: None Specimens: None Implants: Implant Name Type Inv. Item Serial No. Manufacturer Lot No. LRB No. Used Action   SCREW 5.0X38 SMALL F/PERFORM - [id number] Screw SCREW 5.0X38 SMALL F/PERFORM  [employer]  Left 1 Implanted  SCREW 5.5X26 - [id number] Screw SCREW 5.5X26  [employer]  Left 2 Implanted  SCREW 5.5X22 - [id number] Screw SCREW 5.5X22  [employer]  Left 1 Implanted  GLENOSPHERE REV SHOULDER 36 - [id number] Joint GLENOSPHERE REV SHOULDER 36 [id number] [employer]  Left 1 Implanted  CUP HUM REV INSERT SZ3/4 36 - [id number] Orthopedic Implant CUP HUM REV INSERT SZ3/4 36 [id number] [employer]  Left 1 Implanted  STEM HUM PERFORM 3+ - [id number] Stem STEM HUM PERFORM 3+ [id number] [employer]  Left 1 Implanted    Indications for Surgery:   Bonnie Moon is a 73 y.o. female with end-stage glenohumeral arthritis with significant retroversion and glenoid dysplasia.  Benefits and risks of operative and nonoperative management were discussed prior to surgery with patient/guardian(s) and informed consent form was completed.  Infection and need for further surgery were discussed as was prosthetic stability and cuff issues.  We additionally specifically discussed risks of axillary nerve injury, infection, periprosthetic fracture, continued pain and longevity of implants prior to beginning procedure.      Procedure:   The patient was identified in the preoperative holding area where the surgical site was marked. Block placed by anesthesia with exparel.  The patient was taken to the OR where a procedural timeout was called and the above noted anesthesia was induced.  The patient was positioned beachchair on allen table with spider arm positioner.  Preoperative antibiotics were dosed.  The patient's left shoulder was prepped and draped in the usual sterile fashion.  A second preoperative timeout was called.       Standard deltopectoral approach was  performed with a #10 blade. We dissected down to the subcutaneous tissues and the cephalic vein was taken laterally with the deltoid. Clavipectoral fascia was incised in  line with the incision. Deep retractors were placed. The long of the biceps tendon was identified and there was significant tenosynovitis present.  Tenodesis was performed to the pectoralis tendon with #2 Ethibond. The remaining biceps was followed up into the rotator interval where it was released.   The subscapularis was taken down in a full thickness layer with capsule along the humeral neck extending inferiorly around the humeral head. We continued releasing the capsule directly off of the osteophytes inferiorly all the way around the corner. This allowed [place] to dislocate the humeral head.   The humeral head had evidence of severe osteoarthritic wear with full-thickness cartilage loss and exposed subchondral bone. There was significant flattening of the humeral head.   The rotator cuff was carefully examined and noted to be irreperably torn.  The decision was confirmed that a reverse total shoulder was indicated for this patient.  There were osteophytes along the inferior humeral neck. The osteophytes were removed with an osteotome and a rongeur.  Osteophytes were removed with a rongeur and an osteotome and the anatomic neck was well visualized.     A humeral cutting guide was used extra medullary with a pin to help control version. The version was set at 20 of retroversion. Humeral osteotomy was performed with an oscillating saw. The head fragment was passed off the back table.  A cut protector plate was placed.  The subscapularis was again identified and immediately we took care to palpate the axillary nerve anteriorly and verify its position with gentle palpation as well as the tug test.  We then released the SGHL with bovie cautery prior to placing a curved mayo at the junction of the anterior glenoid well above the axillary nerve and bluntly dissecting the subscapularis from the capsule.  We then carefully protected the axillary nerve as we gently released the inferior capsule to fully mobilize  the subscapularis.  An anterior deltoid retractor was then placed as well as a small Hohmann retractor superiorly.   The glenoid was inspected and had evidence of severe osteoarthritic wear with full-thickness cartilage loss and exposed subchondral bone.   The remaining labrum was removed circumferentially taking great care not to disrupt the posterior capsule.   At this point we felt based on blueprint templating that a patient-specific implant was necessary.  We cleared the peripheral soft tissues and used a scraping device to get down to subchondral bone.  At that point we used the three-dimensional guides to adequately recreate the version.  We used our custom guide and placed a directly on the glenoid.  We placed our center pin as well as our derotational pin.  Once we confirmed appropriate position we reamed the center pin to the above length.  We then prepared our final implant after irrigating and placed it with good secure fit.  There was great bony apposition of the implant and native bone.  We placed our 2 locking and 2 nonlocking screws as this typical for patient-specific instrumentation.   Next a 36 mm glenosphere was selected and impacted onto the baseplate. The center screw was tightened.  We turned attention back to the humeral side. The cut protector was removed.  We used the perform humeral sizing block to select the appropriate size which for this patient was a 3.  We then placed our center pin  and reamed over it concentrically obtaining appropriate inset.  We then used our lateralizing chisel to prepare the lateral aspect of the humerus.  Using this trial implant we trialed multiple polyethylene sizes settling on a +3 which provided good stability and range of motion without excess soft tissue tension. The offset was dialed in to match the normal anatomy. The shoulder was trialed.  There was good ROM in all planes and the shoulder was stable with no inferior translation.  The real  humeral implants were opened after again confirming sizes.  The trial was removed. #5 Fiberwire x4 sutures passed through the humeral neck for subscap repair. The humeral component was press-fit obtaining a secure fit. The joint was reduced and thoroughly irrigated with pulsatile lavage. Subscap was repaired back with #5 Fiberwire sutures through bone tunnels. Hemostasis was obtained. The deltopectoral interval was reapproximated with #1 Ethibond. The subcutaneous tissues were closed with 2-0 Vicryl and the skin was closed with running monocryl.    The wounds were cleaned and dried and an Aquacel dressing was placed. The drapes taken down. The arm was placed into sling with abduction pillow. Patient was awakened, extubated, and transferred to the recovery room in stable condition. There were no intraoperative complications. The sponge, needle, and attention counts were correct at the end of the case.     Alfonse Alpers, PA-C, present and scrubbed throughout the case, critical for completion in a timely fashion, and for retraction, instrumentation, closure.

## 2023-10-28 ENCOUNTER — Ambulatory Visit: Payer: Self-pay | Admitting: Family Medicine

## 2023-10-28 ENCOUNTER — Telehealth: Payer: Self-pay

## 2023-10-28 NOTE — Telephone Encounter (Signed)
Copied From CRM [id number]. Reason for Triage: Patient called in regarding blood pressure, stated she had shoulder surgery yesterday and blood pressure was low. Was advised if blood pressure was still low to reach out to primary care today- blood pressure read 107/58 this morning. Patient states she is concerned because blood pressure is usually always elevated but now it is low and she is wondering if taking medication is okay    Chief Complaint: Low blood pressure Symptoms: Low blood pressure, no symptoms  Frequency: Started yesterday after shoulder surgery  Pertinent Negatives: Patient denies current dizziness or weakness Disposition: [] ED /[] Urgent Care (no appt availability in office) / [] Appointment(In office/virtual)/ []  [facility]/ [x] Home Care/ [] Refused Recommended Disposition /[]  Mobile Bus/ []  Follow-up with PCP Additional Notes: Patient reports that she had shoulder surgery yesterday, and has had episodes of low blood pressure. She states that approximately 1 hour ago she checked it and it was 107/58. Patient reports her blood pressure usually runs high and was unsure if she should take her medication. She denies any symptoms with her blood pressure change. She is concerned about taking her blood pressure medication, stating she does not want her blood pressure to go too low.    Patient advised to continue to monitor her blood pressure and that I would advise the office of her blood pressure and ask that they contact her with instructions for her blood pressure medication this weekend. Patient instructed to call back or to follow up with urgent care or the ED for new or worsening symptoms. Patient verbalized understanding and agreement of this plan.    Reason for Disposition  [1] Fall in systolic BP > 20 mm Hg from normal AND [2] NOT dizzy, lightheaded, or weak    Modifications, patient will monitor over weekend and call for appointment if blood pressure does not  improve  Answer Assessment - Initial Assessment Questions 1. BLOOD PRESSURE: "What is the blood pressure?" "Did you take at least two measurements 5 minutes apart?"     107/58 2. ONSET: "When did you take your blood pressure?"     1 hour ago 3. HOW: "How did you obtain the blood pressure?" (e.g., visiting nurse, automatic home BP monitor)     Automatic BP monitor  4. HISTORY: "Do you have a history of low blood pressure?" "What is your blood pressure normally?"     No 5. MEDICINES: "Are you taking any medications for blood pressure?" If Yes, ask: "Have they been changed recently?"     Takes medication for high blood pressure  6. PULSE RATE: "Do you know what your pulse rate is?"      81 7. OTHER SYMPTOMS: "Have you been sick recently?" "Have you had a recent injury?"     Feeling lightheaded but unsure if it's due to nerve block from yesterday  Protocols used: Blood Pressure - Low-A-AH

## 2023-10-28 NOTE — Telephone Encounter (Signed)
Copied from CRM [id number]. Topic: Clinical - Medical Advice >> Oct 28, 2023 11:46 AM Larwance Sachs wrote: Reason for CRM: Patient called in regarding blood pressure, asked if Dr. Casimiro Needle could please reach out to her directly today or Monday- message was sent to triage nurse but patient is addiment on speaking with provider as well

## 2023-10-28 NOTE — Telephone Encounter (Signed)
See prior triage note.

## 2023-10-28 NOTE — Telephone Encounter (Signed)
 See previous note

## 2023-10-28 NOTE — Telephone Encounter (Signed)
Patient informed of the message below.  Patient was also advised to go to the ER or an urgent care if she is feeling faint or dizziness over the weekend.

## 2023-10-28 NOTE — Telephone Encounter (Signed)
107 and 105 are still in the normal range-- it is likely low due to the pain medication they prescribed her after her surgery and the surgery itself, so the low pressures are likely just temporary. She should continue her medications as prescribed, drink plenty of fluids to stay hydrated and if she gets dizzy or feels faint she should call us back. She can also wear compression stocking to support her blood pressures as well.

## 2023-10-31 ENCOUNTER — Encounter (HOSPITAL_BASED_OUTPATIENT_CLINIC_OR_DEPARTMENT_OTHER): Payer: Self-pay | Admitting: Orthopaedic Surgery

## 2023-11-25 ENCOUNTER — Other Ambulatory Visit: Payer: Self-pay | Admitting: Family Medicine

## 2023-12-11 ENCOUNTER — Other Ambulatory Visit: Payer: Self-pay | Admitting: Family Medicine

## 2023-12-11 DIAGNOSIS — I10 Essential (primary) hypertension: Secondary | ICD-10-CM

## 2024-01-09 LAB — HM DEXA SCAN

## 2024-01-18 LAB — LAB REPORT - SCANNED
A1c: 5.4
EGFR: 81
Free T4: 1.31 ng/dL
TSH: 0.79 (ref 0.41–5.90)

## 2024-02-09 ENCOUNTER — Ambulatory Visit (INDEPENDENT_AMBULATORY_CARE_PROVIDER_SITE_OTHER): Admitting: Family Medicine

## 2024-02-09 ENCOUNTER — Encounter: Payer: Self-pay | Admitting: Family Medicine

## 2024-02-09 VITALS — BP 140/82 | HR 62 | Temp 98.0°F | Ht 63.0 in | Wt 147.4 lb

## 2024-02-09 DIAGNOSIS — I10 Essential (primary) hypertension: Secondary | ICD-10-CM | POA: Diagnosis not present

## 2024-02-09 NOTE — Assessment & Plan Note (Addendum)
 Current hypertension medications:       Sig   telmisartan -hydrochlorothiazide (MICARDIS  HCT) 80-25 MG tablet (Taking) TAKE 1 TABLET BY MOUTH EVERY DAY      Patient stopped the amlodipine  due to dizziness. She wants to try diet and exercise as well as supplements to help lower her blood pressure without adding any additional medication. I counseled the patient for 20 minutes about magnesium and beet root powder, increasing exercise safely and monitoring her BP regularly at home. I will see her in 4 months for BP recheck. She will continue the above listed medication as prescribed. I reviewed the labs she brought with her from the endocrinologist office.

## 2024-02-09 NOTE — Progress Notes (Signed)
 Established Patient Office Visit  Subjective   Patient ID: Bonnie Moon, female    DOB: June 05, 1951  Age: 73 y.o. MRN: [id number]  Chief Complaint  Patient presents with   Medical Management of Chronic Issues    Pt is here for follow up today on her HTN. She brought me her blood work today from the endocrinologist and we reviewed this together. States she just completed her PT on her left shoulder after having surgery.   Patient states that she stopped the amlodipine , states that before she had her surgery she was having was some dizzy spells, states that if she got up too fast or once when she bent over     Current Outpatient Medications  Medication Instructions   Ascorbic Acid (VITAMIN C PO) 250 mg, Daily   azelastine  (ASTELIN ) 0.1 % nasal spray 1 spray, Each Nare, 2 times daily, Use in each nostril as directed   cetirizine  (ZYRTEC  ALLERGY) 10 mg, Oral, Daily   famotidine (PEPCID) 20 mg, As needed   fluticasone  (FLONASE ) 50 MCG/ACT nasal spray 1 spray, Each Nare, Daily   Multiple Vitamins-Minerals (MULTIVITAMIN PO) Take by mouth. 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     Objective:      BP (!) 140/82   Pulse 62   Temp 98 F (36.7 C) (Oral)   Ht 5\' 3"  (1.6 m)   Wt 147 lb 6.4 oz (66.9 kg)   LMP 07/28/2002   SpO2 99%   BMI 26.11 kg/m    Physical Exam Vitals reviewed.  Constitutional:      Appearance: Normal appearance. She is well-groomed and normal weight.  Eyes:     Conjunctiva/sclera: Conjunctivae normal.  Cardiovascular:     Heart sounds: S1 normal and S2 normal.  Pulmonary:     Effort: Pulmonary effort is normal.     Breath sounds: Normal air entry.  Musculoskeletal:     Right lower leg: No edema.     Left lower leg: No edema.  Neurological:     Mental Status: She is alert and oriented to person, place, and time. Mental status is at baseline.     Gait: Gait is intact.  Psychiatric:        Mood and Affect: Mood and affect normal.        Speech: Speech normal.        Behavior: Behavior normal.        Judgment: Judgment normal.      No results found for any visits on 02/09/24.    The 10-year ASCVD risk score (Arnett DK, et al., 2019) is: 15.4%    Assessment & Plan:  Primary hypertension Assessment & Plan: Current  hypertension medications:       Sig   telmisartan -hydrochlorothiazide (MICARDIS  HCT) 80-25 MG tablet (Taking) TAKE 1 TABLET BY MOUTH EVERY DAY      Patient stopped the amlodipine  due to dizziness. She wants to try diet and exercise as well as supplements to help lower her blood pressure without adding any additional medication. I counseled the patient for 20 minutes about magnesium and beet root powder, increasing exercise safely and monitoring her BP regularly at home. I will see her in 4 months for BP recheck. She will continue the above listed medication as prescribed. I reviewed the labs she brought with her from the endocrinologist office.       Return in about 4 months (around 06/11/2024) for HTN.    Aida House, MD

## 2024-02-09 NOTE — Patient Instructions (Addendum)
 Magnesium oxide or gluconate 400 mg at bedtime to help lower blood pressure  Beet root powder-- 3500 to 7000 mg per day

## 2024-02-24 ENCOUNTER — Ambulatory Visit: Payer: Self-pay | Admitting: Family Medicine

## 2024-02-27 ENCOUNTER — Telehealth: Payer: Self-pay | Admitting: *Deleted

## 2024-02-27 NOTE — Telephone Encounter (Signed)
 See results note.

## 2024-02-27 NOTE — Telephone Encounter (Signed)
 Copied from CRM [id number]. Topic: Clinical - Lab/Test Results >> Feb 27, 2024  3:05 PM Clyde Darling P wrote: Reason for CRM: Pt called relay the results to pt, pt understood and had no further question.

## 2024-03-15 ENCOUNTER — Ambulatory Visit: Payer: Medicare Other | Admitting: Family Medicine

## 2024-05-17 ENCOUNTER — Encounter: Payer: Self-pay | Admitting: Gastroenterology

## 2024-05-17 ENCOUNTER — Ambulatory Visit (INDEPENDENT_AMBULATORY_CARE_PROVIDER_SITE_OTHER): Admitting: Gastroenterology

## 2024-05-17 VITALS — BP 136/80 | HR 78 | Ht 64.0 in | Wt 146.2 lb

## 2024-05-17 DIAGNOSIS — R140 Abdominal distension (gaseous): Secondary | ICD-10-CM

## 2024-05-17 DIAGNOSIS — R12 Heartburn: Secondary | ICD-10-CM

## 2024-05-17 DIAGNOSIS — K219 Gastro-esophageal reflux disease without esophagitis: Secondary | ICD-10-CM

## 2024-05-17 DIAGNOSIS — Z8619 Personal history of other infectious and parasitic diseases: Secondary | ICD-10-CM | POA: Diagnosis not present

## 2024-05-17 DIAGNOSIS — K5909 Other constipation: Secondary | ICD-10-CM | POA: Diagnosis not present

## 2024-05-17 MED ORDER — FAMOTIDINE 20 MG PO TABS: 20.0000 mg | ORAL_TABLET | Freq: Two times a day (BID) | ORAL | 2 refills | Status: DC

## 2024-05-17 NOTE — Patient Instructions (Addendum)
 GERD  Recommend GERD diet Famotidine  20 mg twice daily , take 1 tablet 30-45 mins before breakfast and dinner  Constipation Recommend high fiber diet OTC Miralax po every other day  Your provider has ordered [employer] stool testing for you. You have received a kit from our office today containing all necessary supplies to complete this test. Please carefully read the stool collection instructions provided in the kit before opening the accompanying materials. In addition, be sure there is a label providing your full name and date of birth on the puritan opti-swab tube that is supplied in the kit (if you do not see a label with this information on your test tube, please make us  aware before test collection!). After completing the test, you should secure the purtian tube into the specimen biohazard bag. The [facility] E-Req sheet (including date and time of specimen collection) should be placed into the outside pocket of the specimen biohazard bag and returned to the [place] lab (basement floor of [facility]) within 3 days of collection. Please make sure to give the specimen to a staff member at the lab. DO NOT leave the specimen on the counter.   If the specimen date and time (can be found in the upper right boxed portion of the sheet) are not filled out on the E-Req sheet, the test will NOT be performed.   _______________________________________________________  If your blood pressure at your visit was 140/90 or greater, please contact your primary care physician to follow up on this.  _______________________________________________________  If you are age 71 or older, your body mass index should be between 23-30. Your Body mass index is 25.1 kg/m. If this is out of the aforementioned range listed, please consider follow up with your Primary Care Provider.  If you are age 83 or younger, your body mass index should be between 19-25. Your Body mass index is 25.1  kg/m. If this is out of the aformentioned range listed, please consider follow up with your Primary Care Provider.   ________________________________________________________  The [facility] providers would like to encourage you to use MYCHART to communicate with providers for non-urgent requests or questions.  Due to long hold times on the telephone, sending your provider a message by [facility] may be a faster and more efficient way to get a response.  Please allow 48 business hours for a response.  Please remember that this is for non-urgent requests.  _______________________________________________________  [facility] is using a team-based approach to care.  Your team is made up of your doctor and two to three APPS. Our APPS (Nurse Practitioners and Physician Assistants) work with your physician to ensure care continuity for you. They are fully qualified to address your health concerns and develop a treatment plan. They communicate directly with your gastroenterologist to care for you. Seeing the Advanced Practice Practitioners on your physician's team can help you by facilitating care more promptly, often allowing for earlier appointments, access to diagnostic testing, procedures, and other specialty referrals.   Thank you for trusting me with your gastrointestinal care. Deanna May, FNP-C

## 2024-05-17 NOTE — Progress Notes (Addendum)
 Chief Complaint:bloating, acid reflux Primary GI Doctor:(previously Dr. Aneita) Dr. Suzann  HPI:  Patient is a  73  year old female patient with past medical history of GERD, hyperlipidemia, and hypertension, who presents for a evaluation of bloating,acid reflux .   Patient last seen in [facility] on 07/30/21 by Dr. Aneita for colonoscopy for history of colonic polyps.   Interval History     Patient presents for evaluation of reflux and chronic constipation. She reports the reflux has increased since April after several people got sick in her hometown from Norovirus, including herself.  She denies pyrosis or regurgitation but states she has sour taste in her mouth. Denies dysphagia. Patient states her symptoms are worse with eating certain types of foods such as tomatoes.  Patient had leftover omeprazole  20 mg at home which helped some.  But patient states she has concerns with being on medication long-term and asked for alternative therapies.  Appetite is good.  No nausea or vomiting.  No NSAID use.  Patient does mention she has a history of H. pylori that was treated several years ago.     Patient also states she has had chronic constipation with bloating most of her life and will go every 3 to 4 days without a bowel movement.  Patient has taken over-the-counter MiraLAX as needed but not sure if she could take it on a more regular basis.  Wt Readings from Last 3 Encounters:  05/17/24 146 lb 4 oz (66.3 kg)  02/09/24 147 lb 6.4 oz (66.9 kg)  10/20/23 145 lb (65.8 kg)     Past Medical History:  Diagnosis Date   Allergy    Complication of anesthesia 2021   hypotension after surgery and had to stay overnight   GERD (gastroesophageal reflux disease)    Hyperlipidemia    Hypertension    IBS (irritable bowel syndrome)    Nephrolithiasis    Osteopenia    Thyroid  nodule    Tubular adenoma of colon 02/2009   Vitamin D  insufficiency     Past Surgical History:  Procedure Laterality Date   BREAST  ENHANCEMENT SURGERY     COLONOSCOPY  04/11/2014   Dr.Stark   PARATHYROIDECTOMY  01/17/2017   done by Dr. Clarice- at [facility] in [place]  (1/2 parathyroid remains)   REVERSE SHOULDER ARTHROPLASTY Left 10/27/2023   Procedure: REVERSE SHOULDER ARTHROPLASTY;  Surgeon: Cristy Bonner DASEN, MD;  Location: [facility];  Service: Orthopedics;  Laterality: Left;   REVERSE SHOULDER ARTHROPLASTY  2025   TONSILLECTOMY     TOTAL HIP ARTHROPLASTY Left 05/2008   TOTAL HIP ARTHROPLASTY Right 04/02/2020   Dr Inda patient    Current Outpatient Medications  Medication Sig Dispense Refill   Ascorbic Acid (VITAMIN C PO) Take 250 mg by mouth daily.     Cholecalciferol (VITAMIN D -3) 1000 UNITS CAPS Take 2,000 Units by mouth.     famotidine  (PEPCID ) 20 MG tablet Take 1 tablet (20 mg total) by mouth 2 (two) times daily. 60 tablet 2   fluticasone  (FLONASE ) 50 MCG/ACT nasal spray Place 1 spray into both nostrils daily. 16 g 1   Multiple Vitamins-Minerals (MULTIVITAMIN PO) Take by mouth. gummies 2 daily     rosuvastatin (CRESTOR) 5 MG tablet TAKE 1 TABLET BY MOUTH EVERY DAY 90 tablet 3   telmisartan -hydrochlorothiazide (MICARDIS  HCT) 80-25 MG tablet TAKE 1 TABLET BY MOUTH EVERY DAY 90 tablet 1   valACYclovir  (VALTREX ) 1000 MG tablet TAKE TWO TABLETS BY MOUTH AS NEEDED WITH  ONSET OF COLD SORES AND REPEAT TWO IN 12 HOURS 180 tablet 1   azelastine  (ASTELIN ) 0.1 % nasal spray Place 1 spray into both nostrils 2 (two) times daily. Use in each nostril as directed (Patient not taking: Reported on 05/17/2024) 30 mL 2   cetirizine  (ZYRTEC  ALLERGY) 10 MG tablet Take 1 tablet (10 mg total) by mouth daily. (Patient not taking: Reported on 05/17/2024) 90 tablet 3   famotidine  (PEPCID ) 20 MG tablet Take 20 mg by mouth as needed for heartburn or indigestion. (Patient not taking: Reported on 05/17/2024)     No current facility-administered medications for this visit.    Allergies as of 05/17/2024 - Review Complete  05/17/2024  Allergen Reaction Noted   Lipitor [atorvastatin calcium] Other (See Comments) 02/23/2019    Family History  Problem Relation Age of Onset   Osteopenia Mother    Heart disease Mother        a. fib   High Cholesterol Mother    Heart failure Mother        pacemaker   Other Mother        parathyroid disease   Atrial fibrillation Father    Alzheimer's disease Father    Other Father        pacemaker   Other Sister        parathyroid   Thyroid  nodules Brother    Thyroid  disease Daughter        nodule, cancer   High Cholesterol Daughter    Colon cancer Neg Hx    Pancreatic cancer Neg Hx    Rectal cancer Neg Hx    Stomach cancer Neg Hx    Esophageal cancer Neg Hx    Colon polyps Neg Hx     Review of Systems:    Constitutional: No weight loss, fever, chills, weakness or fatigue HEENT: Eyes: No change in vision               Ears, Nose, Throat:  No change in hearing or congestion Skin: No rash or itching Cardiovascular: No chest pain, chest pressure or palpitations   Respiratory: No SOB or cough Gastrointestinal: See HPI and otherwise negative Genitourinary: No dysuria or change in urinary frequency Neurological: No headache, dizziness or syncope Musculoskeletal: No new muscle or joint pain Hematologic: No bleeding or bruising Psychiatric: No history of depression or anxiety    Physical Exam:  Vital signs: BP 136/80   Pulse 78   Ht 5' 4 (1.626 m)   Wt 146 lb 4 oz (66.3 kg)   LMP 07/28/2002   BMI 25.10 kg/m   Constitutional:   Pleasant  female appears to be in NAD, Well developed, Well nourished, alert and cooperative Throat: Oral cavity and pharynx without inflammation, swelling or lesion.  Respiratory: Respirations even and unlabored. Lungs clear to auscultation bilaterally.   No wheezes, crackles, or rhonchi.  Cardiovascular: Normal S1, S2. Regular rate and rhythm. No peripheral edema, cyanosis or pallor.  Gastrointestinal:  Soft, nondistended,  nontender. No rebound or guarding. Hypoactive bowel sounds. No appreciable masses or hepatomegaly. Rectal:  Not performed.  Msk:  Symmetrical without gross deformities. Without edema, no deformity or joint abnormality.  Neurologic:  Alert and  oriented x4;  grossly normal neurologically.  Skin:   Dry and intact without significant lesions or rashes.  RELEVANT LABS AND IMAGING: CBC    Latest Ref Rng & Units 02/28/2019    9:49 AM 12/08/2015    9:18 AM 10/08/2014    8:57 AM  CBC  WBC 4.0 - 10.5 K/uL 5.3  5.6  6.1   Hemoglobin 12.0 - 15.0 g/dL 85.7  86.1  86.2   Hematocrit 36.0 - 46.0 % 41.5  41.4  41.5   Platelets 150.0 - 400.0 K/uL 273.0  267  255      CMP     Latest Ref Rng & Units 10/24/2023   11:00 AM 06/21/2023   11:37 AM 02/28/2019    9:49 AM  CMP  Glucose 70 - 99 mg/dL 82  92  83   BUN 8 - 23 mg/dL 15  13  15    Creatinine 0.44 - 1.00 mg/dL 9.25  9.25  9.21   Sodium 135 - 145 mmol/L 132  134  140   Potassium 3.5 - 5.1 mmol/L 4.1  3.7  4.2   Chloride 98 - 111 mmol/L 97  96  102   CO2 22 - 32 mmol/L 24  29  29    Calcium 8.9 - 10.3 mg/dL 9.8  9.9  9.8    9.7   Total Protein 6.0 - 8.3 g/dL  7.6  7.1   Total Bilirubin 0.2 - 1.2 mg/dL  1.0  0.9   Alkaline Phos 39 - 117 U/L  92  95   AST 0 - 37 U/L  25  17   ALT 0 - 35 U/L  19  10      Lab Results  Component Value Date   TSH 0.79 01/09/2024   07/2021 colonoscopy - One 6 mm polyp in the ascending colon, removed with a cold snare. Resected and retrieved. - Mild diverticulosis in the left colon.  - The examination was otherwise normal on direct and retroflexion views. Path: Surgical [P], colon, ascending, polyp (1) TUBULAR ADENOMA. NO HIGH-GRADE DYSPLASIA OR MALIGNANCY.  Assessment: Encounter Diagnoses  Name Primary?   Chronic constipation Yes   Gastroesophageal reflux disease, unspecified whether esophagitis present    History of Helicobacter pylori infection    Waterbrash    Bloating     73 year old female patient who  presents with waterbrash over the last 6 months.  We discussed strict GERD diet and starting with famotidine  once to twice daily.  Will also go ahead and recheck H. pylori Diatherix due to patient's history.  If no improvement we can consider PPI therapy which patient would like to try to avoid.    Patient also has chronic constipation with bloating and I reassured the patient that she can take over-the-counter MiraLAX on a more regular basis to help manage her symptoms. Also recommended high fiber diet. Patient up-to-date on colonoscopy screening, next due in November 2029.  Plan: - Recommend GERD diet, no late meals 3-4 hours before lying down - Start Pepcid  20 mg twice daily  -Check h pylori diathereix stool  -Recommend high fiber diet -OTC Miralax po every other day  -colonoscopy recall 07/2028 --follow-up 3-4 mths with APP  Thank you for the courtesy of this consult. Please call me with any questions or concerns.   Marysue Fait, FNP-C [facility] 05/17/2024, 4:34 PM  Cc: Ozell Heron HERO, MD  I have reviewed the clinic note as outlined by Cathryne Beal, NP and agree with the assessment, plan and medical decision making.  Roxanna presents to the office today for evaluation and management of waterbrash likely related to GERD.  Agree with checking Diatherix H. pylori testing.  Reasonable to start trial of Pepcid .  No alarm features at this time to EGD.  Augment fiber  in diet and utilize MiraLAX for symptoms of constipation and bloating.  Inocente Hausen, MD

## 2024-05-20 ENCOUNTER — Other Ambulatory Visit: Payer: Self-pay | Admitting: Family Medicine

## 2024-06-06 ENCOUNTER — Telehealth: Payer: Self-pay | Admitting: Gastroenterology

## 2024-06-06 NOTE — Telephone Encounter (Signed)
 Pt stated that she is having an increase in abdominal discomfort, loose stools and bloating. Pt stated that she has a stool sample test  that she received back on 05/17/2024 in her office visit. Pt was notified to return the stool sample test, and try a BRAT diet until the results came back. Pt verbalized understanding with all questions answered.

## 2024-06-06 NOTE — Telephone Encounter (Signed)
 Inbound call from patient stating that she is going to a specimen tomorrow to the lab. She states her symptoms  have gotten worse. She bloats evey time she eats and she is having a hard time with her GERD and does not feel well. Patient was offered abn appointment on 9/17 with Deanna May and is requesting a call to discuss if she can be seen sooner. Please advise.

## 2024-06-12 ENCOUNTER — Encounter: Payer: Self-pay | Admitting: Family Medicine

## 2024-06-12 ENCOUNTER — Ambulatory Visit (INDEPENDENT_AMBULATORY_CARE_PROVIDER_SITE_OTHER): Admitting: Family Medicine

## 2024-06-12 VITALS — BP 128/80 | HR 70 | Temp 98.2°F | Ht 64.0 in | Wt 140.6 lb

## 2024-06-12 DIAGNOSIS — I10 Essential (primary) hypertension: Secondary | ICD-10-CM

## 2024-06-12 NOTE — Patient Instructions (Signed)
 Fiber -- 30-35 grams per day   Probiotics-- start with once a day and if needed can take it up to 3 times --Florastor -- take with meals  Take miralax as needed

## 2024-06-12 NOTE — Progress Notes (Signed)
 Established Patient Office Visit  Subjective   Patient ID: Bonnie Moon, female    DOB: [birth year]  Age: 73 y.o. MRN: [id number]  Chief Complaint  Patient presents with   Medical Management of Chronic Issues    Pt is here for follow up on her blood pressure. Last visit the amlodipine  was stopped due to side effects. States that she went on a new diet, has avoided caffeine, fried food, is walking daily, has lost about 6 pounds in the last month. States that lately she is having some stomach issues. Went to the GI office and it was recommended she start pepcid  BID, states it has helped some with her reflux, states that she is having more liquid/ greenish stools, pt reports also occasional nausea, bloating and some mild diffuse abdominal pain.      Current Outpatient Medications  Medication Instructions   Ascorbic Acid (VITAMIN C PO) 250 mg, Daily   azelastine  (ASTELIN ) 0.1 % nasal spray 1 spray, Each Nare, 2 times daily, Use in each nostril as directed   cetirizine  (ZYRTEC  ALLERGY) 10 mg, Oral, Daily   famotidine  (PEPCID ) 20 mg, As needed   famotidine  (PEPCID ) 20 mg, Oral, 2 times daily   famotidine  (PEPCID ) 40 mg, 2 times daily   fluticasone  (FLONASE ) 50 MCG/ACT nasal spray 1 spray, Each Nare, Daily   Multiple Vitamins-Minerals (MULTIVITAMIN PO) Take by mouth. 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     Objective:     BP 128/80   Pulse 70   Temp 98.2 F (36.8 C) (Oral)   Ht 5' 4 (1.626 m)   Wt 140 lb 9.6 oz (63.8 kg)   LMP 07/28/2002   SpO2 99%   BMI 24.13 kg/m    Physical Exam Vitals reviewed.  Constitutional:      Appearance: Normal appearance. She is well-groomed and normal weight.  Eyes:     Conjunctiva/sclera: Conjunctivae normal.  Cardiovascular:     Heart sounds: S1 normal and S2 normal.  Pulmonary:     Effort: Pulmonary effort is normal.     Breath sounds: Normal air entry.  Musculoskeletal:     Right lower leg: No edema.     Left lower leg: No edema.  Neurological:     Mental Status: She is alert and oriented to person, place, and time. Mental status is at baseline.     Gait: Gait is intact.  Psychiatric:        Mood and Affect: Mood and affect normal.        Speech: Speech normal.        Behavior: Behavior normal.        Judgment: Judgment normal.  No results found for any visits on 06/12/24.    The 10-year ASCVD risk score (Arnett DK, et al., 2019) is: 13%    Assessment & Plan:  Primary hypertension Assessment & Plan: Current hypertension medications:       Sig   telmisartan -hydrochlorothiazide (MICARDIS  HCT) 80-25 MG tablet (Taking) TAKE 1 TABLET BY MOUTH EVERY DAY      BP is well controlled, no side effects reported. Will continue this medication as prescribed.       No follow-ups on file.    Heron CHRISTELLA Sharper, MD

## 2024-06-12 NOTE — Telephone Encounter (Signed)
 PT is calling for an update on stool sample. She sent in on 9/11 and still no results. Please advise.

## 2024-06-12 NOTE — Telephone Encounter (Signed)
 H Pylori - negative

## 2024-06-13 NOTE — Telephone Encounter (Signed)
 Left message for pt to call back

## 2024-06-14 NOTE — Telephone Encounter (Signed)
 Last read by [name] at 3:50PM on 06/13/2024.

## 2024-06-17 NOTE — Assessment & Plan Note (Signed)
 Current hypertension medications:       Sig   telmisartan -hydrochlorothiazide (MICARDIS  HCT) 80-25 MG tablet (Taking) TAKE 1 TABLET BY MOUTH EVERY DAY      BP is well controlled, no side effects reported. Will continue this medication as prescribed.

## 2024-06-18 ENCOUNTER — Other Ambulatory Visit: Payer: Self-pay

## 2024-06-18 ENCOUNTER — Telehealth: Payer: Self-pay | Admitting: Gastroenterology

## 2024-06-18 DIAGNOSIS — R109 Unspecified abdominal pain: Secondary | ICD-10-CM

## 2024-06-18 DIAGNOSIS — R634 Abnormal weight loss: Secondary | ICD-10-CM

## 2024-06-18 DIAGNOSIS — R194 Change in bowel habit: Secondary | ICD-10-CM

## 2024-06-18 NOTE — Telephone Encounter (Signed)
 Received a call from the patient stating her stool test was negative but her past symptoms havae still been affecting her, runny & abnormal stools and etc. Requesting nurse fu call to discuss solutions. Please review and advise  Thank you

## 2024-06-18 NOTE — Telephone Encounter (Signed)
 Pt made aware of recent recommendations.  Pt was ordered and scheduled for the CT scan on 06/21/2024 at 5:15 PM at [facility].  Pt to arrive at 3:00 to check in and start drinking oral contrast at 3:15 PM. Pt made aware.  Pt verbalized understanding with all questions answered.

## 2024-06-18 NOTE — Telephone Encounter (Signed)
 Pt stated that she is still having ongoing symptoms. Pt stated she recently received a message that her stool test was negative. Pt stated that she has been having ongoing issues for months now. Last Office visit was on 05/17/2024. Pt stated that every time that she has a BM it is loose, mucous, yellow green. Last BM two 2 days ago.  Pt stated that she has not had a soft stool in over a month.Pt stated that she has been watching her diet very closely in what she has been eating as instructed by Cathryne Beal NP at last office visit. Pt stated that she was 146 and now 137 lb. Still having abdominal pain and discomfort. Pt requesting imaging be done if Deanna May NP recommends.  Please review and advise.

## 2024-06-21 ENCOUNTER — Other Ambulatory Visit: Payer: Self-pay | Admitting: Gastroenterology

## 2024-06-21 ENCOUNTER — Ambulatory Visit (HOSPITAL_COMMUNITY)
Admission: RE | Admit: 2024-06-21 | Discharge: 2024-06-21 | Disposition: A | Discharge status: Home / Self Care | Source: Ambulatory Visit | Attending: Gastroenterology | Admitting: Gastroenterology

## 2024-06-21 DIAGNOSIS — R194 Change in bowel habit: Secondary | ICD-10-CM

## 2024-06-21 DIAGNOSIS — R634 Abnormal weight loss: Secondary | ICD-10-CM

## 2024-06-21 DIAGNOSIS — R109 Unspecified abdominal pain: Secondary | ICD-10-CM | POA: Diagnosis present

## 2024-06-21 MED ORDER — IOHEXOL 9 MG/ML PO SOLN
1000.0000 mL | ORAL | Status: AC
  Administered 2024-06-21: 1000 mL via ORAL

## 2024-06-21 MED ORDER — IOHEXOL 300 MG/ML  SOLN
100.0000 mL | Freq: Once | INTRAMUSCULAR | Status: AC | PRN
  Administered 2024-06-21: 100 mL via INTRAVENOUS

## 2024-06-21 MED ORDER — IOHEXOL 9 MG/ML PO SOLN
ORAL | Status: AC
  Filled 2024-06-21: qty 1000

## 2024-06-26 ENCOUNTER — Ambulatory Visit: Payer: Self-pay | Admitting: Gastroenterology

## 2024-06-26 ENCOUNTER — Encounter: Payer: Self-pay | Admitting: Gastroenterology

## 2024-07-16 ENCOUNTER — Encounter: Payer: Self-pay | Admitting: Family Medicine

## 2024-07-16 ENCOUNTER — Ambulatory Visit (INDEPENDENT_AMBULATORY_CARE_PROVIDER_SITE_OTHER): Admitting: Family Medicine

## 2024-07-16 VITALS — BP 132/90 | HR 68 | Temp 97.6°F | Ht 64.0 in | Wt 141.7 lb

## 2024-07-16 DIAGNOSIS — E782 Mixed hyperlipidemia: Secondary | ICD-10-CM | POA: Diagnosis not present

## 2024-07-16 NOTE — Progress Notes (Signed)
 Established Patient Office Visit  Subjective   Patient ID: Bonnie Moon, female    DOB: 09-15-1951  Age: 73 y.o. MRN: [id number]  Chief Complaint  Patient presents with   Results    Patient presents to discuss CT abdomen results    HPI Discussed the use of AI scribe software for clinical note transcription with the patient, who gave verbal consent to proceed.  History of Present Illness   Bonnie Moon is a 73 year old female who presents for follow-up on recent CT scan results.  A recent CT scan shows a 7 mm right renal stone. She has a history of renal stones requiring surgical intervention. She is monitoring for right-sided pain and hematuria.  The CT scan also notes aortic atherosclerosis. She takes rosuvastatin 5 mg for cholesterol management due to intolerance to higher doses.  Her calcium levels, previously elevated due to parathyroid issues, are now normal at 9.8 as of January.  She experiences occasional shortness of breath when walking fast and talking but not during other activities. Her home blood pressure readings are usually normal.  Her weight has increased by three pounds, attributed to improved appetite after resolving stomach issues. She aims to maintain her weight under 140 pounds.       Current Outpatient Medications  Medication Instructions   Ascorbic Acid (VITAMIN C PO) 250 mg, Daily   azelastine  (ASTELIN ) 0.1 % nasal spray 1 spray, Each Nare, 2 times daily, Use in each nostril as directed   cetirizine  (ZYRTEC  ALLERGY) 10 mg, Oral, Daily   famotidine  (PEPCID ) 20 mg, As needed   famotidine  (PEPCID ) 20 mg, Oral, 2 times daily   famotidine  (PEPCID ) 40 mg, 2 times daily   fluticasone  (FLONASE ) 50 MCG/ACT nasal spray 1 spray, Each Nare, Daily   Multiple Vitamins-Minerals (MULTIVITAMIN PO) Take by mouth. 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     Objective:     BP (!) 132/90   Pulse 68   Temp 97.6 F (36.4 C) (Oral)   Ht 5' 4 (1.626 m)   Wt 141 lb 11.2 oz (64.3 kg)   LMP 07/28/2002   SpO2 99%   BMI 24.32 kg/m    Physical Exam Vitals reviewed.  Constitutional:      Appearance: Normal appearance. She is well-groomed and normal weight.  Cardiovascular:     Rate and Rhythm: Normal rate and regular rhythm.     Heart sounds: S1 normal and S2 normal.  Pulmonary:     Effort: Pulmonary effort is normal.     Breath sounds: Normal breath sounds and air entry.  Neurological:     Mental Status: She is alert and oriented to person, place,  and time. Mental status is at baseline.     Gait: Gait is intact.  Psychiatric:        Mood and Affect: Mood and affect normal.        Speech: Speech normal.        Behavior: Behavior normal.        Judgment: Judgment normal.      No results found for any visits on 07/16/24.    The 10-year ASCVD risk score (Arnett DK, et al., 2019) is: 13.8%    Assessment & Plan:  Mixed hyperlipidemia   Assessment and Plan    Right renal stone A 7 mm right renal stone identified on CT scan, located in the kidney. History of renal stones requiring surgical intervention. - Monitor for symptoms such as right-sided pain  or hematuria. - Contact provider if symptoms develop.  Aortic atherosclerosis Aortic atherosclerosis with plaque present in the aorta, not causing issues due to the large caliber of the artery. Discussion about the potential link between past hypercalcemia and plaque buildup. Current calcium levels are normal, and she is on rosuvastatin for prevention of heart disease. - Continue rosuvastatin 5 mg daily. - Consider CT calcium score if concerned about coronary artery calcification. - Monitor for symptoms such as chest pain, shortness of breath, or exercise intolerance. - Review lab results from endocrinologist and follow up as needed.  Hyperlipidemia Currently managed with rosuvastatin 5 mg. Previous cholesterol levels were within target range. Current management deemed appropriate. - Continue rosuvastatin 5 mg daily. - Review lab results from endocrinologist and follow up as needed.  Hypertension Blood pressure recorded at 132/90, slightly elevated but within acceptable range. Possible anxiety-related elevation due to recent stressors. - Continue current blood pressure management. - Monitor blood pressure at home. - Follow up if blood pressure remains elevated.  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 - Review lab results from endocrinologist and follow up as needed.  Adrenal nodule, likely benign A very small adrenal nodule identified on CT scan, likely benign. No current symptoms or concerns related to the nodule.        Return in about 3 months (around 10/16/2024) for AWV in person.    Heron CHRISTELLA Sharper, MD

## 2024-07-31 ENCOUNTER — Ambulatory Visit (HOSPITAL_BASED_OUTPATIENT_CLINIC_OR_DEPARTMENT_OTHER): Payer: Medicare Other | Admitting: Obstetrics & Gynecology

## 2024-08-11 ENCOUNTER — Other Ambulatory Visit: Payer: Self-pay | Admitting: Gastroenterology

## 2024-08-11 DIAGNOSIS — K219 Gastro-esophageal reflux disease without esophagitis: Secondary | ICD-10-CM

## 2024-10-09 NOTE — Progress Notes (Unsigned)
 "  ANNUAL EXAM Patient name: Bonnie Moon MRN [id number]  Date of birth: 01/07/1951 Chief Complaint:   No chief complaint on file.  History of Present Illness:   Bonnie Moon is a 74 y.o. G4P3 Caucasian female being seen today for a routine annual exam.  Current complaints: ***  Patient's last menstrual period was 07/28/2002.   The pregnancy intention screening data noted above was reviewed. Potential methods of contraception were discussed. The patient elected to proceed with No data recorded.   Last pap 12/22/2020. Results were: NILM w/ HRHPV not done. H/O abnormal pap: {yes/yes***/no:23866} Last mammogram: 07/14/2023. Results were: normal. Family h/o breast cancer: {yes***/no:23838} Last colonoscopy: 07/30/2021. Results were: abnormal 1 polyp. Family h/o colorectal cancer: {yes***/no:23838}     09/14/2023   10:46 AM 07/29/2022    9:39 AM 07/02/2022   11:31 AM 11/30/2021   12:17 PM 08/11/2021    1:10 PM  Depression screen PHQ 2/9  Decreased Interest 0 0 0 0 0  Down, Depressed, Hopeless 0 0 0 0 0  PHQ - 2 Score 0 0 0 0 0  Altered sleeping 0  0 0   Tired, decreased energy 0  0 0   Change in appetite 0  0 0   Feeling bad or failure about yourself  0  0 0   Trouble concentrating 0  0 0   Moving slowly or fidgety/restless 0  0 0   Suicidal thoughts 0  0 0   PHQ-9 Score 0   0  0       Data saved with a previous flowsheet row definition         No data to display           Review of Systems:   Pertinent items are noted in HPI Denies any headaches, blurred vision, fatigue, shortness of breath, chest pain, abdominal pain, abnormal vaginal discharge/itching/odor/irritation, problems with periods, bowel movements, urination, or intercourse unless otherwise stated above. Pertinent History Reviewed:  Reviewed past medical,surgical, social and family history.  Reviewed problem list, medications and allergies. Physical Assessment:  There were no vitals filed for this  visit.There is no height or weight on file to calculate BMI.        Physical Examination:   General appearance - well appearing, and in no distress  Mental status - alert, oriented to person, place, and time  Psych:  She has a normal mood and affect  Skin - warm and dry, normal color, no suspicious lesions noted  Chest - effort normal, all lung fields clear to auscultation bilaterally  Heart - normal rate and regular rhythm  Neck:  midline trachea, no thyromegaly or nodules  Breasts - breasts appear normal, no suspicious masses, no skin or nipple changes or  axillary nodes  Abdomen - soft, nontender, nondistended, no masses or organomegaly  Pelvic - VULVA: normal appearing vulva with no masses, tenderness or lesions  VAGINA: normal appearing vagina with normal color and discharge, no lesions  CERVIX: normal appearing cervix without discharge or lesions, no CMT  Thin prep pap is {Desc; done/not:10129} *** HR HPV cotesting  UTERUS: uterus is felt to be normal size, shape, consistency and nontender   ADNEXA: No adnexal masses or tenderness noted.  Rectal - normal rectal, good sphincter tone, no masses felt. Hemoccult: ***  Extremities:  No swelling or varicosities noted  Chaperone present for exam  No results found for this or any previous visit (from the past 24 hours).  Assessment & Plan:  1) Well-Woman Exam  2) ***  Labs/procedures today: ***  Mammogram: {Mammo f/u:25212::@ 74yo}, or sooner if problems Colonoscopy: {TCS f/u:25213::@ 74yo}, or sooner if problems  No orders of the defined types were placed in this encounter.   Meds: No orders of the defined types were placed in this encounter.   Follow-up: No follow-ups on file.  Morna LOISE Quale, RN 10/09/2024 8:33 AM "

## 2024-10-10 ENCOUNTER — Other Ambulatory Visit (HOSPITAL_COMMUNITY)
Admission: RE | Admit: 2024-10-10 | Discharge: 2024-10-10 | Disposition: A | Source: Ambulatory Visit | Attending: Obstetrics & Gynecology | Admitting: Obstetrics & Gynecology

## 2024-10-10 ENCOUNTER — Encounter (HOSPITAL_BASED_OUTPATIENT_CLINIC_OR_DEPARTMENT_OTHER): Payer: Self-pay | Admitting: Obstetrics & Gynecology

## 2024-10-10 ENCOUNTER — Ambulatory Visit (INDEPENDENT_AMBULATORY_CARE_PROVIDER_SITE_OTHER): Admitting: Obstetrics & Gynecology

## 2024-10-10 VITALS — BP 120/77 | HR 66 | Ht 63.5 in | Wt 142.4 lb

## 2024-10-10 DIAGNOSIS — Z9889 Other specified postprocedural states: Secondary | ICD-10-CM

## 2024-10-10 DIAGNOSIS — Z01419 Encounter for gynecological examination (general) (routine) without abnormal findings: Secondary | ICD-10-CM | POA: Diagnosis not present

## 2024-10-10 DIAGNOSIS — M810 Age-related osteoporosis without current pathological fracture: Secondary | ICD-10-CM

## 2024-10-10 DIAGNOSIS — Z124 Encounter for screening for malignant neoplasm of cervix: Secondary | ICD-10-CM | POA: Diagnosis not present

## 2024-10-10 DIAGNOSIS — Z1151 Encounter for screening for human papillomavirus (HPV): Secondary | ICD-10-CM | POA: Diagnosis not present

## 2024-10-10 DIAGNOSIS — Z1331 Encounter for screening for depression: Secondary | ICD-10-CM

## 2024-10-10 DIAGNOSIS — Z9089 Acquired absence of other organs: Secondary | ICD-10-CM

## 2024-10-10 NOTE — Patient Instructions (Signed)
 Second pneumonia vaccine is pneumovax

## 2024-10-12 LAB — CYTOLOGY - PAP: Diagnosis: NEGATIVE

## 2024-10-15 ENCOUNTER — Ambulatory Visit (HOSPITAL_BASED_OUTPATIENT_CLINIC_OR_DEPARTMENT_OTHER): Payer: Self-pay | Admitting: Obstetrics & Gynecology

## 2024-10-26 ENCOUNTER — Ambulatory Visit
